# Patient Record
Sex: Female | Born: 1935 | Race: Black or African American | Hispanic: No | Smoking: Never smoker
Health system: Southern US, Community
[De-identification: ages and names within clinical notes are randomized; demographics above are authoritative.]

## PROBLEM LIST (undated history)

## (undated) DIAGNOSIS — I10 Essential (primary) hypertension: Secondary | ICD-10-CM

## (undated) DIAGNOSIS — I480 Paroxysmal atrial fibrillation: Secondary | ICD-10-CM

## (undated) DIAGNOSIS — I2510 Atherosclerotic heart disease of native coronary artery without angina pectoris: Secondary | ICD-10-CM

## (undated) HISTORY — PX: CARDIAC SURGERY: SHX584

## (undated) HISTORY — PX: MITRAL VALVE SURGERY: SHX714

---

## 2011-02-12 ENCOUNTER — Emergency Department (HOSPITAL_BASED_OUTPATIENT_CLINIC_OR_DEPARTMENT_OTHER)
Admission: EM | Admit: 2011-02-12 | Discharge: 2011-02-12 | Disposition: A | Discharge status: Home / Self Care | Payer: Medicare Other | Attending: Emergency Medicine | Admitting: Emergency Medicine

## 2011-02-12 DIAGNOSIS — N898 Other specified noninflammatory disorders of vagina: Secondary | ICD-10-CM | POA: Insufficient documentation

## 2011-02-12 DIAGNOSIS — Z79899 Other long term (current) drug therapy: Secondary | ICD-10-CM | POA: Insufficient documentation

## 2011-02-12 DIAGNOSIS — E119 Type 2 diabetes mellitus without complications: Secondary | ICD-10-CM | POA: Insufficient documentation

## 2011-02-12 DIAGNOSIS — I10 Essential (primary) hypertension: Secondary | ICD-10-CM | POA: Insufficient documentation

## 2011-02-12 LAB — BASIC METABOLIC PANEL
CO2: 31 mEq/L (ref 19–32)
Calcium: 8.8 mg/dL (ref 8.4–10.5)
Creatinine, Ser: 1.2 mg/dL (ref 0.4–1.2)
Glucose, Bld: 97 mg/dL (ref 70–99)
Sodium: 143 mEq/L (ref 135–145)

## 2011-02-12 LAB — CBC
MCH: 29.9 pg (ref 26.0–34.0)
MCHC: 33 g/dL (ref 30.0–36.0)
Platelets: 94 10*3/uL — ABNORMAL LOW (ref 150–400)
RDW: 14.2 % (ref 11.5–15.5)

## 2011-02-12 LAB — PROTIME-INR: Prothrombin Time: 14.5 seconds (ref 11.6–15.2)

## 2011-02-16 ENCOUNTER — Emergency Department (HOSPITAL_BASED_OUTPATIENT_CLINIC_OR_DEPARTMENT_OTHER)
Admission: EM | Admit: 2011-02-16 | Discharge: 2011-02-16 | Disposition: A | Discharge status: Home / Self Care | Payer: Medicare Other | Attending: Emergency Medicine | Admitting: Emergency Medicine

## 2011-02-16 DIAGNOSIS — Z79899 Other long term (current) drug therapy: Secondary | ICD-10-CM | POA: Insufficient documentation

## 2011-02-16 DIAGNOSIS — I10 Essential (primary) hypertension: Secondary | ICD-10-CM | POA: Insufficient documentation

## 2011-02-16 DIAGNOSIS — Y838 Other surgical procedures as the cause of abnormal reaction of the patient, or of later complication, without mention of misadventure at the time of the procedure: Secondary | ICD-10-CM | POA: Insufficient documentation

## 2011-02-16 DIAGNOSIS — IMO0002 Reserved for concepts with insufficient information to code with codable children: Secondary | ICD-10-CM | POA: Insufficient documentation

## 2011-02-16 DIAGNOSIS — E119 Type 2 diabetes mellitus without complications: Secondary | ICD-10-CM | POA: Insufficient documentation

## 2011-02-16 DIAGNOSIS — D500 Iron deficiency anemia secondary to blood loss (chronic): Secondary | ICD-10-CM | POA: Insufficient documentation

## 2011-02-16 LAB — DIFFERENTIAL
Basophils Absolute: 0 10*3/uL (ref 0.0–0.1)
Eosinophils Relative: 3 % (ref 0–5)
Lymphocytes Relative: 37 % (ref 12–46)
Monocytes Absolute: 0.5 10*3/uL (ref 0.1–1.0)

## 2011-02-16 LAB — CBC
HCT: 25.5 % — ABNORMAL LOW (ref 36.0–46.0)
MCHC: 32.9 g/dL (ref 30.0–36.0)
MCV: 90.1 fL (ref 78.0–100.0)
RDW: 14.2 % (ref 11.5–15.5)

## 2011-02-16 LAB — BASIC METABOLIC PANEL
BUN: 20 mg/dL (ref 6–23)
Chloride: 100 mEq/L (ref 96–112)
Glucose, Bld: 115 mg/dL — ABNORMAL HIGH (ref 70–99)
Potassium: 4.2 mEq/L (ref 3.5–5.1)

## 2011-02-16 LAB — PROTIME-INR: INR: 1.47 (ref 0.00–1.49)

## 2011-04-27 ENCOUNTER — Other Ambulatory Visit: Payer: Self-pay

## 2011-04-27 ENCOUNTER — Emergency Department (HOSPITAL_BASED_OUTPATIENT_CLINIC_OR_DEPARTMENT_OTHER)
Admission: EM | Admit: 2011-04-27 | Discharge: 2011-04-27 | Disposition: A | Discharge status: Home / Self Care | Payer: Medicare Other | Attending: Emergency Medicine | Admitting: Emergency Medicine

## 2011-04-27 DIAGNOSIS — K219 Gastro-esophageal reflux disease without esophagitis: Secondary | ICD-10-CM | POA: Insufficient documentation

## 2011-04-27 DIAGNOSIS — K2970 Gastritis, unspecified, without bleeding: Secondary | ICD-10-CM | POA: Insufficient documentation

## 2011-04-27 DIAGNOSIS — E119 Type 2 diabetes mellitus without complications: Secondary | ICD-10-CM | POA: Insufficient documentation

## 2011-04-27 DIAGNOSIS — I10 Essential (primary) hypertension: Secondary | ICD-10-CM | POA: Insufficient documentation

## 2011-04-27 DIAGNOSIS — R109 Unspecified abdominal pain: Secondary | ICD-10-CM | POA: Insufficient documentation

## 2011-04-27 DIAGNOSIS — I2510 Atherosclerotic heart disease of native coronary artery without angina pectoris: Secondary | ICD-10-CM | POA: Insufficient documentation

## 2011-04-27 HISTORY — DX: Atherosclerotic heart disease of native coronary artery without angina pectoris: I25.10

## 2011-04-27 HISTORY — DX: Essential (primary) hypertension: I10

## 2011-04-27 LAB — CBC
Hemoglobin: 12.3 g/dL (ref 12.0–15.0)
MCHC: 33.9 g/dL (ref 30.0–36.0)
RDW: 13.6 % (ref 11.5–15.5)
WBC: 4.9 10*3/uL (ref 4.0–10.5)

## 2011-04-27 LAB — COMPREHENSIVE METABOLIC PANEL
ALT: 19 U/L (ref 0–35)
AST: 36 U/L (ref 0–37)
Albumin: 3.9 g/dL (ref 3.5–5.2)
Alkaline Phosphatase: 75 U/L (ref 39–117)
Chloride: 100 mEq/L (ref 96–112)
Potassium: 4.1 mEq/L (ref 3.5–5.1)
Total Bilirubin: 0.5 mg/dL (ref 0.3–1.2)

## 2011-04-27 LAB — URINE MICROSCOPIC-ADD ON

## 2011-04-27 LAB — URINALYSIS, ROUTINE W REFLEX MICROSCOPIC
Bilirubin Urine: NEGATIVE
Glucose, UA: NEGATIVE mg/dL
Ketones, ur: NEGATIVE mg/dL
pH: 7.5 (ref 5.0–8.0)

## 2011-04-27 LAB — DIFFERENTIAL
Basophils Absolute: 0 10*3/uL (ref 0.0–0.1)
Basophils Relative: 0 % (ref 0–1)
Neutro Abs: 3.1 10*3/uL (ref 1.7–7.7)
Neutrophils Relative %: 63 % (ref 43–77)

## 2011-04-27 LAB — TROPONIN I: Troponin I: <0.3 ng/mL (ref <0.30)

## 2011-04-27 MED ORDER — GI COCKTAIL ~~LOC~~
30.0000 mL | Freq: Once | ORAL | Status: AC
Start: 2011-04-27 — End: 2011-04-27
  Administered 2011-04-27: 30 mL via ORAL
  Filled 2011-04-27: qty 30

## 2011-04-27 MED ORDER — FAMOTIDINE 20 MG PO TABS: 20.0000 mg | ORAL_TABLET | Freq: Two times a day (BID) | ORAL | Status: AC

## 2011-04-28 NOTE — ED Provider Notes (Signed)
History   The patient presents with a primary complaint of epigastric pain, but additionally reports a "knot" over her xyphoid process, progressive over several years in size, following her previous midline thoracotomy for mechanical mitral valve placement.  She reports that the bump is not tender, but is irritating as it rubs on her bra.    Chief Complaint  Patient presents with  . Abdominal Pain   Patient is a 77 y.o. female presenting with abdominal pain. The history is provided by the patient.  Abdominal Pain The primary symptoms of the illness include abdominal pain. The primary symptoms of the illness do not include fever, fatigue, shortness of breath, nausea, vomiting, diarrhea or hematemesis. The current episode started 2 days ago. The onset of the illness was gradual. Progression since onset: waxing and waning, worse with eating, better with taking Zegerid.  She didn't take her zegerid today.  The illness is associated with laxative use and eating (the patient ate barbeque chicken today making the pain worse). The patient states that she believes she is currently not pregnant. The patient has not had a change in bowel habit. Risk factors for an acute abdominal problem include being elderly. Additional symptoms associated with the illness include anorexia, heartburn and constipation. Symptoms associated with the illness do not include chills, diaphoresis, urgency, hematuria, frequency or back pain. Significant associated medical issues include PUD, GERD and cardiac disease. Significant associated medical issues do not include inflammatory bowel disease, gallstones or liver disease.    Past Medical History  Diagnosis Date  . Hypertension   . Diabetes mellitus   . Coronary artery disease     Past Surgical History  Procedure Date  . Cardiac surgery     History reviewed. No pertinent family history.  History  Substance Use Topics  . Smoking status: Never Smoker   . Smokeless  tobacco: Not on file  . Alcohol Use: No    OB History    Grav Para Term Preterm Abortions TAB SAB Ect Mult Living                  Review of Systems  Constitutional: Positive for appetite change. Negative for fever, chills, diaphoresis and fatigue.  HENT: Negative for voice change.   Respiratory: Negative.  Negative for shortness of breath.   Cardiovascular: Negative.  Negative for chest pain and palpitations.  Gastrointestinal: Positive for heartburn, abdominal pain, constipation and anorexia. Negative for nausea, vomiting, diarrhea, blood in stool, abdominal distention and hematemesis.  Genitourinary: Negative for urgency, frequency and hematuria.  Musculoskeletal: Negative for back pain.  Skin: Negative.   Neurological: Negative for dizziness, syncope, light-headedness and numbness.  Psychiatric/Behavioral: Negative.     Physical Exam  BP 164/76  Pulse 82  Temp(Src) 97.9 F (36.6 C) (Oral)  Resp 18  SpO2 96%  Physical Exam  Nursing note and vitals reviewed. Constitutional: She is oriented to person, place, and time. She appears well-developed and well-nourished. No distress.  HENT:  Head: Normocephalic and atraumatic.  Mouth/Throat: Oropharynx is clear and moist.  Eyes: Conjunctivae and EOM are normal. Pupils are equal, round, and reactive to light. No scleral icterus.  Neck: Normal range of motion. Neck supple. No JVD present. No tracheal deviation present.  Cardiovascular: Normal rate, regular rhythm and S2 normal.  Exam reveals no gallop and no friction rub.   Murmur heard.  Systolic murmur is present with a grade of 3/6       Sounds c/w mechanical mitral valve presence  Pulmonary/Chest: Effort normal and breath sounds normal. No stridor. No respiratory distress. She has no wheezes. She has no rales. She exhibits no tenderness.  Abdominal: Soft. Normal appearance and bowel sounds are normal. She exhibits no distension, no ascites, no pulsatile midline mass and no  mass. There is no hepatosplenomegaly. There is tenderness in the epigastric area. There is no rigidity, no rebound, no guarding, no CVA tenderness and negative Murphy's sign.    Musculoskeletal: Normal range of motion. She exhibits no edema and no tenderness.  Lymphadenopathy:    She has no cervical adenopathy.  Neurological: She is alert and oriented to person, place, and time.  Skin: Skin is warm and dry. No rash noted. She is not diaphoretic. No erythema.  Psychiatric: She has a normal mood and affect.    ED Course  Procedures Pt. Symptoms resolved temporarily with GI coctail before returning.  Normal cardiac enzymes. MDM Myocardial infarction, gastritis, peptic ulcer disease, gastrointestinal pain, GERD, pancreatitis, biliary colic all entertained as possible etiologies amongst other potential causes in the patient's differential diagnosis.   Date: 04/28/2011  Rate: 61  Rhythm: normal sinus rhythm  QRS Axis: left  Intervals: Wide QRS complex - Nonspecific Intraventricular Conduction Delay  ST/T Wave abnormalities: anterolateral t wave inversions  Conduction Disutrbances:nonspecific intraventricular conduction delay  Narrative Interpretation:   Old EKG Reviewed: none available      Felisa Bonier, MD 04/28/11 1523

## 2013-09-28 ENCOUNTER — Observation Stay (HOSPITAL_BASED_OUTPATIENT_CLINIC_OR_DEPARTMENT_OTHER)
Admission: EM | Admit: 2013-09-28 | Discharge: 2013-09-29 | Disposition: A | Discharge status: Home / Self Care | Payer: Medicare Other | Attending: Internal Medicine | Admitting: Internal Medicine

## 2013-09-28 ENCOUNTER — Encounter (HOSPITAL_BASED_OUTPATIENT_CLINIC_OR_DEPARTMENT_OTHER): Payer: Self-pay | Admitting: Emergency Medicine

## 2013-09-28 ENCOUNTER — Emergency Department (HOSPITAL_BASED_OUTPATIENT_CLINIC_OR_DEPARTMENT_OTHER): Payer: Medicare Other

## 2013-09-28 DIAGNOSIS — I2510 Atherosclerotic heart disease of native coronary artery without angina pectoris: Secondary | ICD-10-CM

## 2013-09-28 DIAGNOSIS — N183 Chronic kidney disease, stage 3 unspecified: Secondary | ICD-10-CM

## 2013-09-28 DIAGNOSIS — D649 Anemia, unspecified: Secondary | ICD-10-CM | POA: Diagnosis present

## 2013-09-28 DIAGNOSIS — Z952 Presence of prosthetic heart valve: Secondary | ICD-10-CM

## 2013-09-28 DIAGNOSIS — I10 Essential (primary) hypertension: Secondary | ICD-10-CM | POA: Diagnosis present

## 2013-09-28 DIAGNOSIS — E119 Type 2 diabetes mellitus without complications: Secondary | ICD-10-CM | POA: Diagnosis present

## 2013-09-28 DIAGNOSIS — R079 Chest pain, unspecified: Principal | ICD-10-CM | POA: Diagnosis present

## 2013-09-28 DIAGNOSIS — N189 Chronic kidney disease, unspecified: Secondary | ICD-10-CM | POA: Diagnosis present

## 2013-09-28 DIAGNOSIS — I4891 Unspecified atrial fibrillation: Secondary | ICD-10-CM

## 2013-09-28 LAB — CBC
Hemoglobin: 11.5 g/dL — ABNORMAL LOW (ref 12.0–15.0)
MCHC: 32.8 g/dL (ref 30.0–36.0)
RBC: 3.8 MIL/uL — ABNORMAL LOW (ref 3.87–5.11)

## 2013-09-28 LAB — COMPREHENSIVE METABOLIC PANEL
ALT: 29 U/L (ref 0–35)
Alkaline Phosphatase: 109 U/L (ref 39–117)
GFR calc Af Amer: 41 mL/min — ABNORMAL LOW (ref >90)
Glucose, Bld: 232 mg/dL — ABNORMAL HIGH (ref 70–99)
Potassium: 4.4 mEq/L (ref 3.5–5.1)
Sodium: 139 mEq/L (ref 135–145)
Total Protein: 8 g/dL (ref 6.0–8.3)

## 2013-09-28 LAB — TROPONIN I: Troponin I: <0.3 ng/mL (ref <0.30)

## 2013-09-28 LAB — GLUCOSE, CAPILLARY

## 2013-09-28 MED ORDER — ASPIRIN 81 MG PO CHEW
324.0000 mg | CHEWABLE_TABLET | Freq: Once | ORAL | Status: AC
  Administered 2013-09-28: 324 mg via ORAL
  Filled 2013-09-28: qty 4

## 2013-09-28 NOTE — Plan of Care (Signed)
79 yo F being transferred for CP r/o cardiac, has h/o CAD, MVR, chronic coumadin, her cardiologist is in [place].

## 2013-09-28 NOTE — ED Provider Notes (Signed)
CSN: [id number]     Arrival date & time 09/28/13  2030 History  This chart was scribed for Charles B. Bernette Mayers, MD by Dorothey Baseman, ED Scribe. This patient was seen in room MH09/MH09 and the patient's care was started at 8:45 PM.    Chief Complaint  Patient presents with  . Chest Pain   The history is provided by the patient. No language interpreter was used.   HPI Comments: Alexis Perry is a 79 y.o. Female with a history of HTN and CAD who presents to the Emergency Department complaining of an intermittent, burning and pressure pain to the center of the chest with associated shortness of breath and palpitations onset 3 days ago. She states that the pain is exacerbated with movement and alleviated with rest. She denies any chest pain at this time. She reports that the pain radiated to the left arm earlier today, which has since resolved. Patient reports taking Klonopin at home with mild, temporary relief. She reports that she is currently on an aspirin regimen and takes Coumadin daily for mechanical valve. Patient reports some recent weight loss (141 lbs to 112 lbs). She denies history of cancer. Patient also reports a history of two cardiac surgeries for Mitral valve repair was well as a single vessel CABG with a history of MI secondary to a stent placement in 2006.   Cards- Dr. Heron Nay PCP- Dr. Wallie Char [facility])  Past Medical History  Diagnosis Date  . Hypertension   . Diabetes mellitus   . Coronary artery disease    Past Surgical History  Procedure Laterality Date  . Cardiac surgery     History reviewed. No pertinent family history. History  Substance Use Topics  . Smoking status: Never Smoker   . Smokeless tobacco: Not on file  . Alcohol Use: No   OB History   Grav Para Term Preterm Abortions TAB SAB Ect Mult Living                 Review of Systems  A complete 10 system review of systems was obtained and all systems are negative except as noted in  the HPI and PMH.   Allergies  Review of patient's allergies indicates no known allergies.  Home Medications   Current Outpatient Rx  Name  Route  Sig  Dispense  Refill  . alendronate (FOSAMAX) 70 MG tablet   Oral   Take 70 mg by mouth every 7 (seven) days. Take with a full glass of water on an empty stomach. Take on Thursday         . amLODipine (NORVASC) 5 MG tablet   Oral   Take 5 mg by mouth daily.           Marland Kitchen aspirin 81 MG EC tablet   Oral   Take 81 mg by mouth daily.           . brimonidine (ALPHAGAN P) 0.1 % SOLN   Both Eyes   Place 1 drop into both eyes 2 (two) times daily.           . Calcium Carbonate-Vitamin D (CALTRATE 600+D) 600-400 MG-UNIT per tablet   Oral   Take 1 tablet by mouth daily.           . carvedilol (COREG) 25 MG tablet   Oral   Take 25 mg by mouth 2 (two) times daily with a meal.           . clonazePAM (  KLONOPIN) 1 MG tablet   Oral   Take 0.5 mg by mouth at bedtime as needed. For sleep         . cycloSPORINE (RESTASIS) 0.05 % ophthalmic emulsion   Both Eyes   Place 1 drop into both eyes 2 (two) times daily.           . ferrous sulfate 325 (65 FE) MG tablet   Oral   Take 325 mg by mouth daily with breakfast.          . gabapentin (NEURONTIN) 300 MG capsule   Oral   Take 300 mg by mouth at bedtime.           Marland Kitchen losartan-hydrochlorothiazide (HYZAAR) 100-25 MG per tablet   Oral   Take 1 tablet by mouth daily.           Marland Kitchen loteprednol (LOTEMAX) 0.5 % ophthalmic suspension   Both Eyes   Place 1 drop into both eyes 2 (two) times daily as needed. For eye soreness         . magnesium citrate solution   Oral   Take 296 mLs by mouth once.           . magnesium hydroxide (MILK OF MAGNESIA) 800 MG/5ML suspension   Oral   Take 30 mLs by mouth daily as needed. For indigestion          . Multiple Vitamin (MULTIVITAMIN PO)   Oral   Take 1 tablet by mouth daily.           Marland Kitchen omeprazole-sodium bicarbonate (ZEGERID)  40-1100 MG per capsule   Oral   Take 1 capsule by mouth daily before breakfast.           . Polyethyl Glycol-Propyl Glycol (SYSTANE OP)   Ophthalmic   Apply 1-2 drops to eye as needed. For dry eyes          . pregabalin (LYRICA) 150 MG capsule   Oral   Take 150 mg by mouth 2 (two) times daily.           . Psyllium (METAMUCIL) 48.57 % POWD   Oral   Take 1 each by mouth 2 (two) times daily as needed. For constipation          . senna (SENOKOT) 8.6 MG tablet   Oral   Take 2 tablets by mouth 2 (two) times daily as needed.          . simvastatin (ZOCOR) 40 MG tablet   Oral   Take 40 mg by mouth at bedtime.           . traMADol (ULTRAM) 50 MG tablet   Oral   Take 50 mg by mouth 2 (two) times daily as needed. For pain         . travoprost, benzalkonium, (TRAVATAN) 0.004 % ophthalmic solution   Both Eyes   Place 1 drop into both eyes at bedtime.           Marland Kitchen warfarin (COUMADIN) 5 MG tablet   Oral   Take 5-7.5 mg by mouth daily. Take 5mg  (1 tab) on Mon, Wed, Fri, and Sat. Take 7.5mg  (1 and 1/2 tabs) on Sun, Tues and Thurs           Triage Vitals: BP 125/82  Pulse 109  Temp(Src) 97.6 F (36.4 C) (Oral)  Resp 16  Ht 5\' 2"  (1.575 m)  Wt 112 lb (50.803 kg)  BMI 20.48 kg/m2  SpO2 100%  Physical Exam  Nursing note and vitals reviewed. Constitutional: She is oriented to person, place, and time. She appears well-developed and well-nourished.  HENT:  Head: Normocephalic and atraumatic.  Eyes: EOM are normal. Pupils are equal, round, and reactive to light.  Neck: Normal range of motion. Neck supple.  Cardiovascular: Normal rate, normal heart sounds and intact distal pulses.   Atrial fibrillation.   Pulmonary/Chest: Effort normal and breath sounds normal.  Abdominal: Soft. Bowel sounds are normal. She exhibits no distension. There is no tenderness.  Musculoskeletal: Normal range of motion. She exhibits no edema and no tenderness.  Neurological: She is alert and  oriented to person, place, and time. She has normal strength. No cranial nerve deficit or sensory deficit.  Skin: Skin is warm and dry. No rash noted.  Psychiatric: She has a normal mood and affect.    ED Course  Procedures (including critical care time)  DIAGNOSTIC STUDIES: Oxygen Saturation is 100% on room air, normal by my interpretation.    COORDINATION OF CARE: 8:50 PM- Ordered an EKG, blood labs, and a chest x-ray. Will order an aspirin to manage symptoms. Discussed that the patient may need to be admitted to the hospital. Discussed treatment plan with patient at bedside and patient verbalized agreement.     Labs Review Labs Reviewed  CBC - Abnormal; Notable for the following:    RBC 3.80 (*)    Hemoglobin 11.5 (*)    HCT 35.1 (*)    All other components within normal limits  COMPREHENSIVE METABOLIC PANEL - Abnormal; Notable for the following:    Glucose, Bld 232 (*)    BUN 34 (*)    Creatinine, Ser 1.40 (*)    AST 46 (*)    GFR calc non Af Amer 35 (*)    GFR calc Af Amer 41 (*)    All other components within normal limits  PROTIME-INR - Abnormal; Notable for the following:    Prothrombin Time 30.8 (*)    INR 3.10 (*)    All other components within normal limits  TROPONIN I   Imaging Review Dg Chest 2 View  09/28/2013   CLINICAL DATA:  Left-sided chest pain. Shortness of breath. Palpitations. Recent 30 lb weight loss.  EXAM: CHEST  2 VIEW  COMPARISON:  None.  FINDINGS: Moderate cardiomegaly noted. Both lungs are clear. No evidence of pleural effusion. Mild ectasia of the thoracic aorta noted. Previous median sternotomy noted.  IMPRESSION: Moderate cardiomegaly.  No active lung disease.   Electronically Signed   By: Myles Rosenthal M.D.   On: 09/28/2013 21:36    EKG Interpretation    Date/Time:  Sunday September 28 2013 20:38:24 EST Ventricular Rate:  106 PR Interval:    QRS Duration: 128 QT Interval:  370 QTC Calculation: 491 R Axis:   -53 Text Interpretation:   Atrial fibrillation with rapid ventricular response Left axis deviation Left ventricular hypertrophy with QRS widening Cannot rule out Septal infarct , age undetermined Lateral infarct , age undetermined Abnormal ECG Since last tracing Rate faster Atrial fibrillation NOW PRESENT Confirmed by Bernette Mayers  MD, CHARLES [phone]) on 09/28/2013 8:44:07 PM            MDM   1. Chest pain   2. New onset a-fib     Pt remains pain free in the ED. New onset afib but rate well controlled also with new pattern of chest pain the last few days. She does not want to go to [facility].  Discussed with Dr. Julian Reil on call at [facility] who will accept the patient for transfer.   I personally performed the services described in this documentation, which was scribed in my presence. The recorded information has been reviewed and is accurate.       Charles B. Bernette Mayers, MD 09/28/13 2255

## 2013-09-28 NOTE — ED Notes (Signed)
Pt reports left sided chest pain with SOB x 3 days off and on

## 2013-09-29 ENCOUNTER — Encounter (HOSPITAL_COMMUNITY): Payer: Self-pay | Admitting: Internal Medicine

## 2013-09-29 DIAGNOSIS — I10 Essential (primary) hypertension: Secondary | ICD-10-CM | POA: Diagnosis present

## 2013-09-29 DIAGNOSIS — I2510 Atherosclerotic heart disease of native coronary artery without angina pectoris: Secondary | ICD-10-CM

## 2013-09-29 DIAGNOSIS — I4891 Unspecified atrial fibrillation: Secondary | ICD-10-CM

## 2013-09-29 DIAGNOSIS — D649 Anemia, unspecified: Secondary | ICD-10-CM | POA: Diagnosis present

## 2013-09-29 DIAGNOSIS — Z952 Presence of prosthetic heart valve: Secondary | ICD-10-CM

## 2013-09-29 DIAGNOSIS — R079 Chest pain, unspecified: Secondary | ICD-10-CM

## 2013-09-29 DIAGNOSIS — Z954 Presence of other heart-valve replacement: Secondary | ICD-10-CM

## 2013-09-29 DIAGNOSIS — E119 Type 2 diabetes mellitus without complications: Secondary | ICD-10-CM | POA: Diagnosis present

## 2013-09-29 DIAGNOSIS — N189 Chronic kidney disease, unspecified: Secondary | ICD-10-CM | POA: Diagnosis present

## 2013-09-29 LAB — PROTIME-INR: Prothrombin Time: 32.8 seconds — ABNORMAL HIGH (ref 11.6–15.2)

## 2013-09-29 LAB — GLUCOSE, CAPILLARY
Glucose-Capillary: 137 mg/dL — ABNORMAL HIGH (ref 70–99)
Glucose-Capillary: 135 mg/dL — ABNORMAL HIGH (ref 70–99)

## 2013-09-29 LAB — TROPONIN I
Troponin I: <0.3 ng/mL (ref <0.30)
Troponin I: <0.3 ng/mL (ref <0.30)
Troponin I: <0.3 ng/mL (ref <0.30)

## 2013-09-29 LAB — CBC
HCT: 30.9 % — ABNORMAL LOW (ref 36.0–46.0)
MCH: 30.5 pg (ref 26.0–34.0)
MCHC: 33 g/dL (ref 30.0–36.0)
Platelets: 130 10*3/uL — ABNORMAL LOW (ref 150–400)
RBC: 3.34 MIL/uL — ABNORMAL LOW (ref 3.87–5.11)
WBC: 4.1 10*3/uL (ref 4.0–10.5)

## 2013-09-29 LAB — BASIC METABOLIC PANEL
Calcium: 9.2 mg/dL (ref 8.4–10.5)
GFR calc Af Amer: 48 mL/min — ABNORMAL LOW (ref >90)
GFR calc non Af Amer: 42 mL/min — ABNORMAL LOW (ref >90)
Glucose, Bld: 146 mg/dL — ABNORMAL HIGH (ref 70–99)
Sodium: 139 mEq/L (ref 135–145)

## 2013-09-29 MED ORDER — SODIUM CHLORIDE 0.9 % IJ SOLN: 3.0000 mL | Freq: Two times a day (BID) | INTRAMUSCULAR | Status: DC

## 2013-09-29 MED ORDER — SIMVASTATIN 40 MG PO TABS
40.0000 mg | ORAL_TABLET | Freq: Every day | ORAL | Status: DC
  Filled 2013-09-29 (×2): qty 1

## 2013-09-29 MED ORDER — WARFARIN - PHARMACIST DOSING INPATIENT: Freq: Every day | Status: DC

## 2013-09-29 MED ORDER — LOSARTAN POTASSIUM-HCTZ 100-25 MG PO TABS: 1.0000 | ORAL_TABLET | Freq: Every day | ORAL | Status: DC

## 2013-09-29 MED ORDER — AMLODIPINE BESYLATE 5 MG PO TABS
5.0000 mg | ORAL_TABLET | Freq: Every day | ORAL | Status: DC
  Administered 2013-09-29: 5 mg via ORAL
  Filled 2013-09-29: qty 1

## 2013-09-29 MED ORDER — DIGOXIN 125 MCG PO TABS: 0.1250 mg | ORAL_TABLET | Freq: Every day | ORAL | Status: DC

## 2013-09-29 MED ORDER — HYDROCHLOROTHIAZIDE 25 MG PO TABS
25.0000 mg | ORAL_TABLET | Freq: Every day | ORAL | Status: DC
  Administered 2013-09-29: 25 mg via ORAL
  Filled 2013-09-29: qty 1

## 2013-09-29 MED ORDER — ACETAMINOPHEN 650 MG RE SUPP: 650.0000 mg | Freq: Four times a day (QID) | RECTAL | Status: DC | PRN

## 2013-09-29 MED ORDER — ACETAMINOPHEN 325 MG PO TABS: 650.0000 mg | ORAL_TABLET | Freq: Four times a day (QID) | ORAL | Status: DC | PRN

## 2013-09-29 MED ORDER — PANTOPRAZOLE SODIUM 40 MG PO TBEC
40.0000 mg | DELAYED_RELEASE_TABLET | Freq: Every day | ORAL | Status: DC
  Administered 2013-09-29: 40 mg via ORAL
  Filled 2013-09-29: qty 1

## 2013-09-29 MED ORDER — LOSARTAN POTASSIUM 50 MG PO TABS
100.0000 mg | ORAL_TABLET | Freq: Every day | ORAL | Status: DC
  Administered 2013-09-29: 100 mg via ORAL
  Filled 2013-09-29: qty 2

## 2013-09-29 MED ORDER — BRIMONIDINE TARTRATE 0.2 % OP SOLN
1.0000 [drp] | Freq: Three times a day (TID) | OPHTHALMIC | Status: DC
  Administered 2013-09-29: 1 [drp] via OPHTHALMIC
  Filled 2013-09-29: qty 5

## 2013-09-29 MED ORDER — LINAGLIPTIN 5 MG PO TABS
5.0000 mg | ORAL_TABLET | Freq: Every day | ORAL | Status: DC
  Administered 2013-09-29: 5 mg via ORAL
  Filled 2013-09-29: qty 1

## 2013-09-29 MED ORDER — CALCIUM CARBONATE-VITAMIN D 500-200 MG-UNIT PO TABS
1.0000 | ORAL_TABLET | Freq: Every day | ORAL | Status: DC
  Administered 2013-09-29: 1 via ORAL
  Filled 2013-09-29 (×2): qty 1

## 2013-09-29 MED ORDER — WARFARIN SODIUM 5 MG PO TABS
5.0000 mg | ORAL_TABLET | Freq: Once | ORAL | Status: DC
  Filled 2013-09-29: qty 1

## 2013-09-29 MED ORDER — PREGABALIN 50 MG PO CAPS
150.0000 mg | ORAL_CAPSULE | Freq: Two times a day (BID) | ORAL | Status: DC
Start: 2013-09-29 — End: 2013-09-29
  Administered 2013-09-29: 150 mg via ORAL
  Filled 2013-09-29: qty 3

## 2013-09-29 MED ORDER — TRAVOPROST (BAK FREE) 0.004 % OP SOLN
1.0000 [drp] | Freq: Every day | OPHTHALMIC | Status: DC
  Filled 2013-09-29: qty 2.5

## 2013-09-29 MED ORDER — ONDANSETRON HCL 4 MG/2ML IJ SOLN: 4.0000 mg | Freq: Four times a day (QID) | INTRAMUSCULAR | Status: DC | PRN

## 2013-09-29 MED ORDER — CYCLOSPORINE 0.05 % OP EMUL
1.0000 [drp] | Freq: Two times a day (BID) | OPHTHALMIC | Status: DC
  Administered 2013-09-29: 1 [drp] via OPHTHALMIC
  Filled 2013-09-29 (×2): qty 1

## 2013-09-29 MED ORDER — GABAPENTIN 300 MG PO CAPS
300.0000 mg | ORAL_CAPSULE | Freq: Every day | ORAL | Status: DC
  Filled 2013-09-29: qty 1

## 2013-09-29 MED ORDER — INSULIN ASPART 100 UNIT/ML ~~LOC~~ SOLN
0.0000 [IU] | Freq: Three times a day (TID) | SUBCUTANEOUS | Status: DC
  Administered 2013-09-29 (×2): 1 [IU] via SUBCUTANEOUS

## 2013-09-29 MED ORDER — ONDANSETRON HCL 4 MG PO TABS: 4.0000 mg | ORAL_TABLET | Freq: Four times a day (QID) | ORAL | Status: DC | PRN

## 2013-09-29 MED ORDER — FERROUS SULFATE 325 (65 FE) MG PO TABS
325.0000 mg | ORAL_TABLET | Freq: Every day | ORAL | Status: DC
  Administered 2013-09-29: 325 mg via ORAL
  Filled 2013-09-29 (×2): qty 1

## 2013-09-29 MED ORDER — DILTIAZEM HCL ER COATED BEADS 120 MG PO CP24: 120.0000 mg | ORAL_CAPSULE | Freq: Every day | ORAL | Status: DC

## 2013-09-29 MED ORDER — DILTIAZEM HCL ER COATED BEADS 120 MG PO CP24
120.0000 mg | ORAL_CAPSULE | Freq: Every day | ORAL | Status: DC
  Administered 2013-09-29: 120 mg via ORAL
  Filled 2013-09-29: qty 1

## 2013-09-29 MED ORDER — CLONAZEPAM 0.5 MG PO TABS: 0.5000 mg | ORAL_TABLET | Freq: Every evening | ORAL | Status: DC | PRN

## 2013-09-29 MED ORDER — CARVEDILOL 25 MG PO TABS
25.0000 mg | ORAL_TABLET | Freq: Two times a day (BID) | ORAL | Status: DC
  Administered 2013-09-29: 25 mg via ORAL
  Filled 2013-09-29 (×3): qty 1

## 2013-09-29 MED ORDER — ASPIRIN EC 81 MG PO TBEC
81.0000 mg | DELAYED_RELEASE_TABLET | Freq: Every day | ORAL | Status: DC
  Administered 2013-09-29: 81 mg via ORAL
  Filled 2013-09-29: qty 1

## 2013-09-29 MED ORDER — TRAMADOL HCL 50 MG PO TABS: 50.0000 mg | ORAL_TABLET | Freq: Two times a day (BID) | ORAL | Status: DC | PRN

## 2013-09-29 MED ORDER — SODIUM CHLORIDE 0.9 % IJ SOLN
3.0000 mL | Freq: Two times a day (BID) | INTRAMUSCULAR | Status: DC
  Administered 2013-09-29: 3 mL via INTRAVENOUS

## 2013-09-29 MED ORDER — ATORVASTATIN CALCIUM 20 MG PO TABS
20.0000 mg | ORAL_TABLET | Freq: Every day | ORAL | Status: DC
  Filled 2013-09-29: qty 1

## 2013-09-29 MED ORDER — SENNA 8.6 MG PO TABS
2.0000 | ORAL_TABLET | Freq: Two times a day (BID) | ORAL | Status: DC | PRN
  Filled 2013-09-29: qty 2

## 2013-09-29 NOTE — Progress Notes (Signed)
ECHO EF 35-40%  Distal anteroseptal and mid to distal lateral wall akinesis (likely old infarct)  Appears comfortable. Heart rate in 60's during echo and at times appears sinus.   Instead of diltiazem CD 120, I would like her to be on Digoxin once a day.   This is in addition to her coreg 25 BID.   She will be seeing her cardiologist on Wed. Will send records.

## 2013-09-29 NOTE — Progress Notes (Deleted)
ANTICOAGULATION CONSULT NOTE - Initial Consult  Pharmacy Consult for Warfarin  Indication: Mechanical valve, ?location  No Known Allergies  Patient Measurements: Height: 5\' 2"  (157.5 cm) Weight: 114 lb 3.2 oz (51.801 kg) IBW/kg (Calculated) : 50.1  Vital Signs: Temp: 97.5 F (36.4 C) (12/14 2340) Temp src: Oral (12/14 2340) BP: 152/58 mmHg (12/14 2340) Pulse Rate: 60 (12/14 2340)  Labs:  Recent Labs  09/28/13 2040  HGB 11.5*  HCT 35.1*  PLT 154  LABPROT 30.8*  INR 3.10*  CREATININE 1.40*  TROPONINI <0.30    Estimated Creatinine Clearance: 26.2 ml/min (by C-G formula based on Cr of 1.4).   Medical History: Past Medical History  Diagnosis Date  . Hypertension   . Diabetes mellitus   . Coronary artery disease     Medications:  Warfarin PTA  Assessment: 80 y/o tx from [facility], INR 3.10 on admit, other labs as above.   Goal of Therapy:  INR (depending on valve location) Monitor platelets by anticoagulation protocol: Yes   Plan:  -PT/INR at 0500 -Unable to get med rec, f/u day shift for PTA dose -F/U valve location for INR goal   Abran Duke 09/29/2013,1:55 AM

## 2013-09-29 NOTE — H&P (Signed)
[facility] History and Physical  Alexis Perry ZOX:[id number] DOB: 09-19-1933 DOA: 09/28/2013  Referring physician: Patient was transferred from [facility]. PCP: No primary provider on file.  Specialists: Dr. Bary Castilla. Cardiologist. [place].  Chief Complaint: Chest pain.  HPI: Alexis Perry is a 79 y.o. female history of mitral valve replacement mechanical, CAD, hypertension, diabetes mellitus presented to the ER because of chest pain. Patient states that she has been experiencing chest pain off and on for last 3 days. On Friday 2 days ago patient started experiencing retrosternal pressure-like symptoms which lasted for 15 minutes which was experienced again in the morning on Saturday. Both these times patient symptoms resolved spontaneously. Last night patient was in a store when she started experiencing chest pain retrosternal and at this time it was slightly burning in sensation. Patient also had mild shortness of breath. Denies any associated diaphoresis palpitations. Symptoms resolved spontaneously after 15 minutes. Patient presented to the ER. In the ER EKG shows atrial fibrillation rate around 106 beats per minute. Patient states that she has not had A. fib previously to her knowledge. Patient's INR was therapeutic, Coumadin she takes for mechanical mitral valve. Cardiac markers were negative. Patient has been admitted for further workup. Patient otherwise denies any nausea vomiting abdominal pain diarrhea headache focal deficits.  Review of Systems: As presented in the history of presenting illness, rest negative.  Past Medical History  Diagnosis Date  . Hypertension   . Diabetes mellitus   . Coronary artery disease    Past Surgical History  Procedure Laterality Date  . Cardiac surgery    . Mitral valve surgery     Social History:  reports that she has never smoked. She does not have any smokeless tobacco history on file. She reports that she does not drink  alcohol or use illicit drugs. Where does patient live home. Can patient participate in ADLs? Yes.  No Known Allergies  Family History:  Family History  Problem Relation Age of Onset  . Stroke Brother       Prior to Admission medications   Medication Sig Start Date End Date Taking? Authorizing Provider  alendronate (FOSAMAX) 70 MG tablet Take 70 mg by mouth every 7 (seven) days. Take with a full glass of water on an empty stomach. Take on Thursday    Historical Provider, MD  amLODipine (NORVASC) 5 MG tablet Take 5 mg by mouth daily.      Historical Provider, MD  aspirin 81 MG EC tablet Take 81 mg by mouth daily.      Historical Provider, MD  brimonidine (ALPHAGAN P) 0.1 % SOLN Place 1 drop into both eyes 2 (two) times daily.      Historical Provider, MD  Calcium Carbonate-Vitamin D (CALTRATE 600+D) 600-400 MG-UNIT per tablet Take 1 tablet by mouth daily.      Historical Provider, MD  carvedilol (COREG) 25 MG tablet Take 25 mg by mouth 2 (two) times daily with a meal.      Historical Provider, MD  clonazePAM (KLONOPIN) 1 MG tablet Take 0.5 mg by mouth at bedtime as needed. For sleep    Historical Provider, MD  cycloSPORINE (RESTASIS) 0.05 % ophthalmic emulsion Place 1 drop into both eyes 2 (two) times daily.      Historical Provider, MD  ferrous sulfate 325 (65 FE) MG tablet Take 325 mg by mouth daily with breakfast.     Historical Provider, MD  gabapentin (NEURONTIN) 300 MG capsule Take 300 mg by mouth  at bedtime.      Historical Provider, MD  losartan-hydrochlorothiazide (HYZAAR) 100-25 MG per tablet Take 1 tablet by mouth daily.      Historical Provider, MD  loteprednol (LOTEMAX) 0.5 % ophthalmic suspension Place 1 drop into both eyes 2 (two) times daily as needed. For eye soreness    Historical Provider, MD  magnesium citrate solution Take 296 mLs by mouth once.      Historical Provider, MD  magnesium hydroxide (MILK OF MAGNESIA) 800 MG/5ML suspension Take 30 mLs by mouth daily as  needed. For indigestion     Historical Provider, MD  Multiple Vitamin (MULTIVITAMIN PO) Take 1 tablet by mouth daily.      Historical Provider, MD  omeprazole-sodium bicarbonate (ZEGERID) 40-1100 MG per capsule Take 1 capsule by mouth daily before breakfast.      Historical Provider, MD  Polyethyl Glycol-Propyl Glycol (SYSTANE OP) Apply 1-2 drops to eye as needed. For dry eyes     Historical Provider, MD  pregabalin (LYRICA) 150 MG capsule Take 150 mg by mouth 2 (two) times daily.      Historical Provider, MD  Psyllium (METAMUCIL) 48.57 % POWD Take 1 each by mouth 2 (two) times daily as needed. For constipation     Historical Provider, MD  senna (SENOKOT) 8.6 MG tablet Take 2 tablets by mouth 2 (two) times daily as needed.     Historical Provider, MD  simvastatin (ZOCOR) 40 MG tablet Take 40 mg by mouth at bedtime.      Historical Provider, MD  traMADol (ULTRAM) 50 MG tablet Take 50 mg by mouth 2 (two) times daily as needed. For pain    Historical Provider, MD  travoprost, benzalkonium, (TRAVATAN) 0.004 % ophthalmic solution Place 1 drop into both eyes at bedtime.      Historical Provider, MD  warfarin (COUMADIN) 5 MG tablet Take 5-7.5 mg by mouth daily. Take 5mg  (1 tab) on Mon, Wed, Fri, and Sat. Take 7.5mg  (1 and 1/2 tabs) on Sun, Tues and Thurs     Historical Provider, MD    Physical Exam: Filed Vitals:   09/28/13 2041 09/28/13 2044 09/28/13 2208 09/28/13 2340  BP: 125/82  127/60 152/58  Pulse: 109  78 60  Temp:  97.6 F (36.4 C)  97.5 F (36.4 C)  TempSrc: Oral Oral  Oral  Resp: 16  25 15   Height: 5\' 2"  (1.575 m)   5\' 2"  (1.575 m)  Weight: 50.803 kg (112 lb)   51.801 kg (114 lb 3.2 oz)  SpO2: 100%  100% 97%     General:  Well-developed and nourished.  Eyes: Anicteric no pallor.  ENT: No discharge from ears eyes nose mouth.  Neck: No mass felt.  Cardiovascular: S1-S2 heard.  Respiratory: No rhonchi or crepitations.  Abdomen: Soft nontender bowel sounds present.  Skin: No  rash.  Musculoskeletal: No edema.  Psychiatric: Appears normal.  Neurologic: Alert awake oriented to time place and person. Moves all extremities.  Labs on Admission:  Basic Metabolic Panel:  Recent Labs Lab 09/28/13 2040  NA 139  K 4.4  CL 101  CO2 28  GLUCOSE 232*  BUN 34*  CREATININE 1.40*  CALCIUM 9.9   Liver Function Tests:  Recent Labs Lab 09/28/13 2040  AST 46*  ALT 29  ALKPHOS 109  BILITOT 0.5  PROT 8.0  ALBUMIN 3.9   No results found for this basename: LIPASE, AMYLASE,  in the last 168 hours No results found for this basename: AMMONIA,  in the last 168 hours CBC:  Recent Labs Lab 09/28/13 2040  WBC 4.7  HGB 11.5*  HCT 35.1*  MCV 92.4  PLT 154   Cardiac Enzymes:  Recent Labs Lab 09/28/13 2040  TROPONINI <0.30    BNP (last 3 results) No results found for this basename: PROBNP,  in the last 8760 hours CBG:  Recent Labs Lab 09/28/13 2254  GLUCAP 209*    Radiological Exams on Admission: Dg Chest 2 View  09/28/2013   CLINICAL DATA:  Left-sided chest pain. Shortness of breath. Palpitations. Recent 30 lb weight loss.  EXAM: CHEST  2 VIEW  COMPARISON:  None.  FINDINGS: Moderate cardiomegaly noted. Both lungs are clear. No evidence of pleural effusion. Mild ectasia of the thoracic aorta noted. Previous median sternotomy noted.  IMPRESSION: Moderate cardiomegaly.  No active lung disease.   Electronically Signed   By: Myles Rosenthal M.D.   On: 09/28/2013 21:36    EKG: Independently reviewed. Atrial fibrillation with rate around 106 beats per minute.  Assessment/Plan Principal Problem:   Chest pain Active Problems:   Diabetes mellitus   New onset a-fib   HTN (hypertension)   Mitral valve replaced   CKD (chronic kidney disease)   Anemia   1. Chest pain - presently chest pain-free. I have ordered further cardiac markers and also 2-D echo. Continue aspirin. 2. Atrial fibrillation - presently rate controlled. Patient is on Coumadin to be  dosed per pharmacy. 3. Mechanical mitral valve - patient's Coumadin is therapeutic. Dosed per pharmacy. Check 2-D echo. 4. Hypertension - continue home medications. 5. Diabetes mellitus type 2 - on Onglyza. 6. Chronic kidney disease - patient states she was told that she has chronic kidney disease. Baseline creatinine not known. Closely follow intake output and metabolic panel. 7. Anemia - probably chronic and may be secondary to kidney disease. Patient is on iron tablets. Closely follow CBC.  Have requested patient's chart from patient's cardiologist at [place]. I also personally reviewed patient's chest x-ray.    Code Status:Full code.  Family Communication: Patient's daughter at the bedside.  Disposition Plan: Admit for observation.    Ednamae Schiano N. [facility] Pager [phone].  If 7PM-7AM, please contact night-coverage [email or web address] Password [email or web address] 09/29/2013, 1:39 AM

## 2013-09-29 NOTE — Progress Notes (Signed)
DC orders received.  Patient stable with no S/S of distress.  Medication and discharge information reviewed with patient and patient's family member.  Patient DC home with family. Alexis Perry  

## 2013-09-29 NOTE — Discharge Summary (Addendum)
Physician Discharge Summary  Alexis Perry HYQ:[id number] DOB: 1933/05/18 DOA: 09/28/2013  PCP: No primary provider on file.  Admit date: 09/28/2013 Discharge date: 09/29/2013  Recommendations for Outpatient Follow-up:  1. Pt will need to follow up with PCP in 2 weeks post discharge 2. Please obtain BMP to evaluate electrolytes and kidney function 3. Please also check CBC to evaluate Hg and Hct levels 4. Follow up with cardiology, Dr. Bary Castilla in [facility]   Discharge Diagnoses:  Principal Problem:   Chest pain Active Problems:   Diabetes mellitus   New onset a-fib   HTN (hypertension)   Mitral valve replaced   CKD (chronic kidney disease)   Anemia   CKD (chronic kidney disease) stage 3, GFR 30-59 ml/min Chest discomfort  -Patient has typical and atypical components  -May also be partly related to anxiety  -She has requested cardiology evaluation  -Troponins negative x3  -Continue aspirin  -anginal symptoms may be caused by intermitten runs of RVR -remain pain free during hospitalization Atrial Fibrillation/history of MVR  -Continue warfarin  -INR 3.10 at the time of admission  -Rate controlled  -Continue carvedilol  -Dr. Anne Fu saw pt and initially recommended starting cardizem CD 120mg  daily and follow up with her cardiologist with whom she already has an appt on 10/01/13.  However after her echo results were interpreted, he stopped diltiazem due to neg inotropic effects and pt was prescribed digoxin 0.125mg  daily. -echo--EF 35-40% with akinesis of anteroseptal and anterolateral myocardium -d/c amlodipine Diabetes mellitus type 2  -Patient states that hemoglobin A1c 6.8 in September 2014  -NovoLog sliding scale  -resume saxagliptin as outpt and follow up with pcp CKD stage III  -Baseline creatinine 1.1-1.2  Hypertension  -Continue carvedilol, losartan, HCTZ  Hyperlipidemia  -Continue statin  Family Communication: Daughter at beside     Discharge Condition:  stable  Disposition: home  Diet:heart healthy Wt Readings from Last 3 Encounters:  09/28/13 51.801 kg (114 lb 3.2 oz)   Brief history  79 year old female with history of diabetes mellitus, hypertension, coronary disease, mitral valve replacement--on warfarin presents with 3 day history of chest discomfort. She states that the discomfort radiated to her left arm. There was no diaphoresis, dizziness, nausea, vomiting, syncope. Patient states that the chest discomfort occurred with activity and was relieved with some rest. She states that she goes to the "fitness center" 2-3 times per week. In the past month, she has noted decreased exercise endurance. Interestingly, the patient did take a dose of clonazepam on the day prior to admission which relieved her chest discomfort. She states that she has had coronary artery bypass graft x1 vessel. She had MVR at [facility] April 2007. In addition, she has been checking blood pressure at home and her monitor has been registering "error" in the heart rate.  In the ED, she was found to be in atrial fibrillation with HR in low 100s. This was confirmed by EKG. An EKG on 04/27/2011 suggested atrial fibrillation. Troponins have been negative x3. The patient remained hemodynamically stable and rate controlled. Cardiology evaluated the patient and did not feel that the patient needed urgent cardioversion nor inpatient stress test. The soft tissue was stable for discharge. The patient will follow up with her established cardiologist on 10/01/2013 with whom she has an appointment. Amlodipine was discontinued. The patient was started on diltiazem.   Consultants: Cardiology--Dr. Anne Fu  Discharge Exam: Filed Vitals:   09/29/13 1251  BP: 151/67  Pulse: 67  Temp: 98.3 F (36.8 C)  Resp: 17   Filed Vitals:   09/29/13 0526 09/29/13 0700 09/29/13 1003 09/29/13 1251  BP: 141/63 162/83 125/58 151/67  Pulse: 67 73 68 67  Temp: 98.4 F (36.9 C)   98.3 F (36.8 C)   TempSrc: Oral   Oral  Resp: 15   17  Height:      Weight:      SpO2: 100%   100%   General: A&O x 3, NAD, pleasant, cooperative Cardiovascular: RRR, no rub, no gallop, no S3 Respiratory: CTAB, no wheeze, no rhonchi Abdomen:soft, nontender, nondistended, positive bowel sounds Extremities: trace LE edema, No lymphangitis, no petechiae  Discharge Instructions      Discharge Orders   Future Orders Complete By Expires   Diet - low sodium heart healthy  As directed    Increase activity slowly  As directed        Medication List    STOP taking these medications       amLODipine 10 MG tablet  Commonly known as:  NORVASC      TAKE these medications       alendronate 70 MG tablet  Commonly known as:  FOSAMAX  Take 70 mg by mouth once a week. Take with a full glass of water on an empty stomach.     aspirin EC 81 MG tablet  Take 81 mg by mouth daily.     atorvastatin 10 MG tablet  Commonly known as:  LIPITOR  Take 10 mg by mouth every evening.     carvedilol 25 MG tablet  Commonly known as:  COREG  Take 12.5 mg by mouth 2 (two) times daily with a meal.     cholecalciferol 1000 UNITS tablet  Commonly known as:  VITAMIN D  Take 1,000 Units by mouth daily.     clonazePAM 1 MG tablet  Commonly known as:  KLONOPIN  Take 0.5 mg by mouth as needed for anxiety.     COMBIGAN 0.2-0.5 % ophthalmic solution  Generic drug:  brimonidine-timolol  Place 1 drop into both eyes every 12 (twelve) hours.     digoxin 0.125 MG tablet  Commonly known as:  LANOXIN  Take 1 tablet (0.125 mg total) by mouth daily.     fexofenadine 30 MG tablet  Commonly known as:  ALLEGRA  Take 30 mg by mouth daily as needed (allergies).     gabapentin 300 MG capsule  Commonly known as:  NEURONTIN  Take 300 mg by mouth every evening.     losartan-hydrochlorothiazide 100-25 MG per tablet  Commonly known as:  HYZAAR  Take 0.5 tablets by mouth daily.     lubiprostone 8 MCG capsule  Commonly known  as:  AMITIZA  Take 8 mcg by mouth daily as needed for constipation.     multivitamin tablet  Take 1 tablet by mouth daily.     OVER THE COUNTER MEDICATION  Apply 1 drop to eye 2 (two) times daily as needed (dry eyes).     pantoprazole 40 MG tablet  Commonly known as:  PROTONIX  Take 40 mg by mouth daily.     saxagliptin HCl 2.5 MG Tabs tablet  Commonly known as:  ONGLYZA  Take 2.5 mg by mouth daily.     senna 8.6 MG Tabs tablet  Commonly known as:  SENOKOT  Take 2 tablets by mouth at bedtime as needed for mild constipation.     SYSTANE OP  Apply 1-2 drops to eye as needed (dry eyes).  traMADol 50 MG tablet  Commonly known as:  ULTRAM  Take 50 mg by mouth 2 (two) times daily as needed for moderate pain.     travoprost (benzalkonium) 0.004 % ophthalmic solution  Commonly known as:  TRAVATAN  Place 1 drop into both eyes at bedtime.     warfarin 5 MG tablet  Commonly known as:  COUMADIN  Take 5-7.5 mg by mouth daily. Takes 1 1/2 tablets (7.5mg ) daily except Monday takes 1 tablet (5mg )         The results of significant diagnostics from this hospitalization (including imaging, microbiology, ancillary and laboratory) are listed below for reference.    Significant Diagnostic Studies: Dg Chest 2 View  09/28/2013   CLINICAL DATA:  Left-sided chest pain. Shortness of breath. Palpitations. Recent 30 lb weight loss.  EXAM: CHEST  2 VIEW  COMPARISON:  None.  FINDINGS: Moderate cardiomegaly noted. Both lungs are clear. No evidence of pleural effusion. Mild ectasia of the thoracic aorta noted. Previous median sternotomy noted.  IMPRESSION: Moderate cardiomegaly.  No active lung disease.   Electronically Signed   By: Myles Rosenthal M.D.   On: 09/28/2013 21:36     Microbiology: No results found for this or any previous visit (from the past 240 hour(s)).   Labs: Basic Metabolic Panel:  Recent Labs Lab 09/28/13 2040 09/29/13 0205  NA 139 139  K 4.4 3.8  CL 101 104  CO2 28  29  GLUCOSE 232* 146*  BUN 34* 34*  CREATININE 1.40* 1.21*  CALCIUM 9.9 9.2   Liver Function Tests:  Recent Labs Lab 09/28/13 2040  AST 46*  ALT 29  ALKPHOS 109  BILITOT 0.5  PROT 8.0  ALBUMIN 3.9   No results found for this basename: LIPASE, AMYLASE,  in the last 168 hours No results found for this basename: AMMONIA,  in the last 168 hours CBC:  Recent Labs Lab 09/28/13 2040 09/29/13 0205  WBC 4.7 4.1  HGB 11.5* 10.2*  HCT 35.1* 30.9*  MCV 92.4 92.5  PLT 154 130*   Cardiac Enzymes:  Recent Labs Lab 09/28/13 2040 09/29/13 0205 09/29/13 0755  TROPONINI <0.30 <0.30 <0.30   BNP: No components found with this basename: POCBNP,  CBG:  Recent Labs Lab 09/28/13 2254 09/29/13 0722 09/29/13 1248  GLUCAP 209* 137* 135*    Time coordinating discharge:  Greater than 30 minutes  Signed:  Jessicca Stitzer, DO [facility] Pager: [phone] 09/29/2013, 1:50 PM

## 2013-09-29 NOTE — Progress Notes (Signed)
  Echocardiogram 2D Echocardiogram has been performed.  Cathie Beams 09/29/2013, 12:14 PM

## 2013-09-29 NOTE — Progress Notes (Signed)
ANTICOAGULATION CONSULT NOTE - Follow-up  Pharmacy Consult for warfarin Indication: mechanical MVR  No Known Allergies  Patient Measurements: Height: 5\' 2"  (157.5 cm) Weight: 114 lb 3.2 oz (51.801 kg) IBW/kg (Calculated) : 50.1  Vital Signs: Temp: 98.4 F (36.9 C) (12/15 0526) Temp src: Oral (12/15 0526) BP: 125/58 mmHg (12/15 1003) Pulse Rate: 68 (12/15 1003)  Labs:  Recent Labs  09/28/13 2040 09/29/13 0205 09/29/13 0755 09/29/13 0948  HGB 11.5* 10.2*  --   --   HCT 35.1* 30.9*  --   --   PLT 154 130*  --   --   LABPROT 30.8*  --   --  32.8*  INR 3.10*  --   --  3.36*  CREATININE 1.40* 1.21*  --   --   TROPONINI <0.30 <0.30 <0.30  --     Estimated Creatinine Clearance: 30.3 ml/min (by C-G formula based on Cr of 1.21).  Assessment: 27 yof presented to the hospital with CP. She is on chronic coumadin for history of mechanical MVR performed in 2007. She verified that her INR goal is 2.5-3.5. INR today is at goal at 3.36. CBC is stable, no bleeding noted.   Goal of Therapy:  INR 2.5-3.5   Plan:  1. Warfarin 5mg  PO x 1 tonight (her normal home dose for Mondays) 2. Daily INR  Jarika Robben, Drake Leach 09/29/2013,11:10 AM

## 2013-09-29 NOTE — Progress Notes (Signed)
ANTICOAGULATION CONSULT NOTE - Initial Consult  Pharmacy Consult for Warfarin  Indication: Mechanical valve, mitral   No Known Allergies  Patient Measurements: Height: 5\' 2"  (157.5 cm) Weight: 114 lb 3.2 oz (51.801 kg) IBW/kg (Calculated) : 50.1  Vital Signs: Temp: 97.5 F (36.4 C) (12/14 2340) Temp src: Oral (12/14 2340) BP: 152/58 mmHg (12/14 2340) Pulse Rate: 60 (12/14 2340)  Labs:  Recent Labs  09/28/13 2040  HGB 11.5*  HCT 35.1*  PLT 154  LABPROT 30.8*  INR 3.10*  CREATININE 1.40*  TROPONINI <0.30    Estimated Creatinine Clearance: 26.2 ml/min (by C-G formula based on Cr of 1.4).   Medical History: Past Medical History  Diagnosis Date  . Hypertension   . Diabetes mellitus   . Coronary artery disease     Medications:  Warfarin PTA  Assessment: 79 y/o tx from [facility], INR 3.10 on admit, other labs as above.   Goal of Therapy:  INR 2.5-3.5 given mitral valve (f/u with pt to ensure this is her goal) Monitor platelets by anticoagulation protocol: Yes   Plan:  -PT/INR at 0500 -Unable to get med rec, f/u day shift for PTA dose -F/U goal INR with patient  Abran Duke 09/29/2013,2:01 AM

## 2013-09-29 NOTE — Progress Notes (Signed)
[facility] PROGRESS NOTE  Alexis Perry:[id number] DOB: [birth year] DOA: 09/28/2013 PCP: No primary provider on file.  Brief history 79 year old female with history of diabetes mellitus, hypertension, coronary disease, mitral valve replacement--on warfarin presents with 3 day history of chest discomfort. She states that the discomfort radiated to her left arm. There was no diaphoresis, dizziness, nausea, vomiting, syncope. Patient states that the chest discomfort occurred with activity and was relieved with some rest. She states that she goes to the "fitness center" 2-3 times per week. In the past month, she has noted decreased exercise endurance. Interestingly, the patient did take a dose of clonazepam on the day prior to admission which relieved her chest discomfort. She states that she has had coronary artery bypass graft x1 vessel. She had MVR at [facility] April 2007. In addition, she has been checking blood pressure at home and her monitor has been registering "error" in the heart rate.  In the ED, she was found to be in atrial fibrillation with HR in low 100s.  This was confirmed by EKG.  An EKG on 04/27/2011  suggested atrial fibrillation. Troponins have been negative x3. The patient remained hemodynamically stable and rate controlled.  Assessment/Plan: Chest discomfort -Patient has typical and atypical components -May also be partly related to anxiety -She has requested cardiology evaluation -Troponins negative x3 -Continue aspirin Atrial Fibrillation/history of MVR -Continue warfarin -INR 3.10 at the time of admission -Rate controlled -Continue carvedilol -Echocardiogram Diabetes mellitus type 2 -Patient states that hemoglobin A1c 6.8 in September 2014 -NovoLog sliding scale CKD stage III -Baseline creatinine 1.1-1.2 Hypertension -Continue carvedilol, losartan, HCTZ Hyperlipidemia -Continue statin  Family Communication:   Daughter at beside Disposition Plan:    Home when medically stable       Procedures/Studies: Dg Chest 2 View  09/28/2013   CLINICAL DATA:  Left-sided chest pain. Shortness of breath. Palpitations. Recent 30 lb weight loss.  EXAM: CHEST  2 VIEW  COMPARISON:  None.  FINDINGS: Moderate cardiomegaly noted. Both lungs are clear. No evidence of pleural effusion. Mild ectasia of the thoracic aorta noted. Previous median sternotomy noted.  IMPRESSION: Moderate cardiomegaly.  No active lung disease.   Electronically Signed   By: Myles Rosenthal M.D.   On: 09/28/2013 21:36         Subjective: Patient denies fevers, chills, chest discomfort, shortness breath, nausea, vomiting, diarrhea, dizziness, syncope. Denies any dysuria, abdominal pain, hematuria.  Objective: Filed Vitals:   09/28/13 2208 09/28/13 2340 09/29/13 0526 09/29/13 0700  BP: 127/60 152/58 141/63 162/83  Pulse: 78 60 67 73  Temp:  97.5 F (36.4 C) 98.4 F (36.9 C)   TempSrc:  Oral Oral   Resp: 25 15 15    Height:  5\' 2"  (1.575 m)    Weight:  51.801 kg (114 lb 3.2 oz)    SpO2: 100% 97% 100%    No intake or output data in the 24 hours ending 09/29/13 0817 Weight change:  Exam:   General:  Pt is alert, follows commands appropriately, not in acute distress  HEENT: No icterus, No thrush,[place]/AT  Cardiovascular: RRR, S1/S2, no rubs, no gallops  Respiratory: CTA bilaterally, no wheezing, no crackles, no rhonchi  Abdomen: Soft/+BS, non tender, non distended, no guarding  Extremities: trace LE edema, No lymphangitis, No petechiae, No rashes, no synovitis  Data Reviewed: Basic Metabolic Panel:  Recent Labs Lab 09/28/13 2040 09/29/13 0205  NA 139 139  K 4.4 3.8  CL 101 104  CO2 28 29  GLUCOSE 232* 146*  BUN 34* 34*  CREATININE 1.40* 1.21*  CALCIUM 9.9 9.2   Liver Function Tests:  Recent Labs Lab 09/28/13 2040  AST 46*  ALT 29  ALKPHOS 109  BILITOT 0.5  PROT 8.0  ALBUMIN 3.9   No results found for this basename: LIPASE, AMYLASE,  in the last  168 hours No results found for this basename: AMMONIA,  in the last 168 hours CBC:  Recent Labs Lab 09/28/13 2040 09/29/13 0205  WBC 4.7 4.1  HGB 11.5* 10.2*  HCT 35.1* 30.9*  MCV 92.4 92.5  PLT 154 130*   Cardiac Enzymes:  Recent Labs Lab 09/28/13 2040 09/29/13 0205  TROPONINI <0.30 <0.30   BNP: No components found with this basename: POCBNP,  CBG:  Recent Labs Lab 09/28/13 2254 09/29/13 0722  GLUCAP 209* 137*    No results found for this or any previous visit (from the past 240 hour(s)).   Scheduled Meds: . amLODipine  5 mg Oral Daily  . aspirin EC  81 mg Oral Daily  . brimonidine  1 drop Both Eyes TID  . calcium-vitamin D  1 tablet Oral Q breakfast  . carvedilol  25 mg Oral BID WC  . cycloSPORINE  1 drop Both Eyes BID  . ferrous sulfate  325 mg Oral Q breakfast  . gabapentin  300 mg Oral QHS  . losartan  100 mg Oral Daily   And  . hydrochlorothiazide  25 mg Oral Daily  . insulin aspart  0-9 Units Subcutaneous TID WC  . linagliptin  5 mg Oral Daily  . pantoprazole  40 mg Oral Daily  . pregabalin  150 mg Oral BID  . simvastatin  40 mg Oral QHS  . sodium chloride  3 mL Intravenous Q12H  . sodium chloride  3 mL Intravenous Q12H  . Travoprost (BAK Free)  1 drop Both Eyes QHS   Continuous Infusions:    Devri Kreher, DO  [facility] Pager [phone]  If 7PM-7AM, please contact night-coverage [email or web address] Password [email or web address] 09/29/2013, 8:17 AM   LOS: 1 day

## 2013-09-29 NOTE — Consult Note (Addendum)
Admit date: 09/28/2013 Referring Physician  Dr. Arbutus Leas Primary Physician No primary provider on file. Primary Cardiologist  Dr. Bary Castilla at [facility].  Reason for Consultation  AFIB and Chest pain  HPI: 79 year old female transferred from [facility] with chief complaint of chest pain. She has a history of mechanical mitral valve replacement, coronary artery disease, hypertension, diabetes who presented to the emergency department because of chest pain on 09/28/13. She had been experiencing this intermittently over the past several days and on Friday had experienced retrosternal pressure lasting for 15 minutes concerning to her. She may have had some pain radiating to her left arm which had resolved. This returned. Slight burning in quality. Mild shortness of breath. No diaphoresis. EKG demonstrated atrial fibrillation 106 beats per minute in the emergency department. Shee denied any previous knowledge of atrial fibrillation. She takes Klonopin for anxiety as well.  She had weight loss from 141 pounds to 112 pounds since 2012 and has had extensive evaluation at [facility] but denies a history of cancer. She also reports a history of 2 cardiac surgeries for mitral valve repair as well as single-vessel bypass in 2006. Her cardiologist is in [facility]. According to emergency room notes, she did not want to go to [facility].  Currently she is standing up, just finished brushing her teeth and feels well. She is quite comfortable, no complaints of any further discomfort. Troponins are normal. EKG unremarkable. See below for further discussion.   PMH:   Past Medical History  Diagnosis Date  . Hypertension   . Diabetes mellitus   . Coronary artery disease     PSH:   Past Surgical History  Procedure Laterality Date  . Cardiac surgery    . Mitral valve surgery     Allergies:  Review of patient's allergies indicates no known allergies. Prior to Admit Meds:   Prior to Admission  medications   Medication Sig Start Date End Date Taking? Authorizing Provider  alendronate (FOSAMAX) 70 MG tablet Take 70 mg by mouth once a week. Take with a full glass of water on an empty stomach.   Yes Historical Provider, MD  amLODipine (NORVASC) 10 MG tablet Take 5 mg by mouth daily.   Yes Historical Provider, MD  aspirin EC 81 MG tablet Take 81 mg by mouth daily.   Yes Historical Provider, MD  atorvastatin (LIPITOR) 10 MG tablet Take 10 mg by mouth every evening.   Yes Historical Provider, MD  brimonidine-timolol (COMBIGAN) 0.2-0.5 % ophthalmic solution Place 1 drop into both eyes every 12 (twelve) hours.   Yes Historical Provider, MD  carvedilol (COREG) 25 MG tablet Take 12.5 mg by mouth 2 (two) times daily with a meal.   Yes Historical Provider, MD  cholecalciferol (VITAMIN D) 1000 UNITS tablet Take 1,000 Units by mouth daily.   Yes Historical Provider, MD  clonazePAM (KLONOPIN) 1 MG tablet Take 0.5 mg by mouth as needed for anxiety.   Yes Historical Provider, MD  fexofenadine (ALLEGRA) 30 MG tablet Take 30 mg by mouth daily as needed (allergies).   Yes Historical Provider, MD  gabapentin (NEURONTIN) 300 MG capsule Take 300 mg by mouth every evening.   Yes Historical Provider, MD  losartan-hydrochlorothiazide (HYZAAR) 100-25 MG per tablet Take 0.5 tablets by mouth daily.   Yes Historical Provider, MD  lubiprostone (AMITIZA) 8 MCG capsule Take 8 mcg by mouth daily as needed for constipation.   Yes Historical Provider, MD  Multiple Vitamin (MULTIVITAMIN) tablet Take  1 tablet by mouth daily.   Yes Historical Provider, MD  OVER THE COUNTER MEDICATION Apply 1 drop to eye 2 (two) times daily as needed (dry eyes).   Yes Historical Provider, MD  pantoprazole (PROTONIX) 40 MG tablet Take 40 mg by mouth daily.   Yes Historical Provider, MD  Polyethyl Glycol-Propyl Glycol (SYSTANE OP) Apply 1-2 drops to eye as needed (dry eyes).   Yes Historical Provider, MD  saxagliptin HCl (ONGLYZA) 2.5 MG TABS  tablet Take 2.5 mg by mouth daily.   Yes Historical Provider, MD  senna (SENOKOT) 8.6 MG TABS tablet Take 2 tablets by mouth at bedtime as needed for mild constipation.   Yes Historical Provider, MD  traMADol (ULTRAM) 50 MG tablet Take 50 mg by mouth 2 (two) times daily as needed for moderate pain.   Yes Historical Provider, MD  travoprost, benzalkonium, (TRAVATAN) 0.004 % ophthalmic solution Place 1 drop into both eyes at bedtime.   Yes Historical Provider, MD  warfarin (COUMADIN) 5 MG tablet Take 5-7.5 mg by mouth daily. Takes 1 1/2 tablets (7.5mg ) daily except Monday takes 1 tablet (5mg )   Yes Historical Provider, MD   Fam HX:    Family History  Problem Relation Age of Onset  . Stroke Brother    Social HX:    History   Social History  . Marital Status: Widowed    Spouse Name: N/A    Number of Children: N/A  . Years of Education: N/A   Occupational History  . Not on file.   Social History Main Topics  . Smoking status: Never Smoker   . Smokeless tobacco: Not on file  . Alcohol Use: No  . Drug Use: No  . Sexual Activity: Not on file   Other Topics Concern  . Not on file   Social History Narrative  . No narrative on file     ROS:  All 11 ROS were addressed and are negative except what is stated in the HPI   Physical Exam: Blood pressure 125/58, pulse 68, temperature 98.4 F (36.9 C), temperature source Oral, resp. rate 15, height 5\' 2"  (1.575 m), weight 114 lb 3.2 oz (51.801 kg), SpO2 100.00%.   General: Thin, in no acute distress Head: Eyes PERRLA, No xanthomas.   Normal cephalic and atramatic  Lungs:   Clear bilaterally to auscultation and percussion. Normal respiratory effort. No wheezes, no rales. Heart:   Irregularly irregular S1 click S2 Pulses are 2+ & equal. 2/6 systolic murmur right upper sternal border, rubs, gallops.  No carotid bruit. No JVD.  No abdominal bruits.  Abdomen: Bowel sounds are positive, abdomen soft and non-tender without masses. No  hepatosplenomegaly. Msk:  Back normal. Normal strength and tone for age. Extremities:  No clubbing, cyanosis or edema.  DP +1 Neuro: Alert and oriented X 3, non-focal, MAE x 4 GU: Deferred Rectal: Deferred Psych:  Good affect, responds appropriately, mildly anxious      Labs: Lab Results  Component Value Date   WBC 4.1 09/29/2013   HGB 10.2* 09/29/2013   HCT 30.9* 09/29/2013   MCV 92.5 09/29/2013   PLT 130* 09/29/2013     Recent Labs Lab 09/28/13 2040 09/29/13 0205  NA 139 139  K 4.4 3.8  CL 101 104  CO2 28 29  BUN 34* 34*  CREATININE 1.40* 1.21*  CALCIUM 9.9 9.2  PROT 8.0  --   BILITOT 0.5  --   ALKPHOS 109  --   ALT 29  --  AST 46*  --   GLUCOSE 232* 146*    Recent Labs  09/28/13 2040 09/29/13 0205 09/29/13 0755  TROPONINI <0.30 <0.30 <0.30     Radiology:  Dg Chest 2 View  09/28/2013   CLINICAL DATA:  Left-sided chest pain. Shortness of breath. Palpitations. Recent 30 lb weight loss.  EXAM: CHEST  2 VIEW  COMPARISON:  None.  FINDINGS: Moderate cardiomegaly noted. Both lungs are clear. No evidence of pleural effusion. Mild ectasia of the thoracic aorta noted. Previous median sternotomy noted.  IMPRESSION: Moderate cardiomegaly.  No active lung disease.   Electronically Signed   By: Myles Rosenthal M.D.   On: 09/28/2013 21:36   Personally viewed.  EKG:  Atrial fibrillation heart rate 106 with nonspecific ST changes, LVH, left axis deviation. Personally viewed.   ASSESSMENT/PLAN:   79 year old female with coronary artery disease status post single vessel bypass surgery as well as mitral valve replacement, mechanical in 2006 here with newly discovered atrial fibrillation, chest pain, anxiety.  1. Chest pain-currently resolved. Feeling well. She has not had a stress test in a few years she states. Troponins were all normal. EKG does not show any worrisome signs of ischemia. She has an appointment on Wednesday with her cardiologist and I'm comfortable with her being  discharged with close followup with him. Given her lack of current symptoms, normal troponin, I do not feel strongly that she needs a nuclear stress test here in the hospital setting and as I said above, she does have excellent followup plan on Wednesday. She has been to several different hospitals in the past, ranging from [place], to [place], [place] and now here. She does keep her primary cardiologist however in [facility]. When asked about her reasoning not to go to [facility], she stated that she remembers the carpet there and was concerned about germs but now she knows that the carpet has been replaced. A family member of hers was recently here for stroke. I tried to reassure her.  2. Mechanical mitral valve-doing well, stable, anticoagulated  3. Atrial fibrillation-she does not remember having this diagnosis previously. Likely, she is anticoagulated with her mitral valve, mechanical. Current heart rates are fairly well controlled. She is currently on carvedilol 25 mg twice a day. In the lieu of amlodipine 5 mg, I will give her diltiazem CD 120 mg to further assist with rate control. She's not in any need of urgent cardioversion. She will be seeing her cardiologist on Wednesday. At that time, there may be consideration for restoration of normal rhythm. Perhaps, with rapid ventricular response, this may exacerbate some anginal like symptoms. Currently doing well.  4. Hyperlipidemia-continue with atorvastatin.  I'm comfortable with her being discharged. Once again, close follow up Wednesday with her cardiologist. She is also comfortable with this plan. On Wednesday, Dr. Bary Castilla can contemplate possible stress testing. If symptoms worsen or become a worrisome, she knows to return or seek medical attention.    Donato Schultz, MD  09/29/2013  10:35 AM   ECHO EF 35-40%  Distal anteroseptal and mid to distal lateral wall akinesis (likely old infarct) prior CABG Appears comfortable. Heart rate in 60's  during echo and at times appears sinus.  Instead of diltiazem CD 120, I would like her to be on Digoxin once a day.  This is in addition to her coreg 25 BID.  She will be seeing her cardiologist on Wed. Will send records.  Relayed message to Dr. Arbutus Leas of hospitalist team.   *  [place]* *[facility]* [address] [place], [place] [address] [phone]  ------------------------------------------------------------ Transthoracic Echocardiography  Patient: Briel, Gallicchio MR #: [id number] Study Date: 09/29/2013 Gender: F Age: 6 Height: 157.5cm Weight: 51.8kg BSA: 1.56m^2 Pt. Status: Room: [facility]  PERFORMING [facility], Echo SONOGRAPHER Cathie Beams ATTENDING Darleene Cleaver Tat, Daun Peacock cc:  ------------------------------------------------------------ LV EF: 35% - 40%  ------------------------------------------------------------ Indications: Atrial fibrillation - 427.31.  ------------------------------------------------------------ History: PMH: Mitral valve replacement. Chronic kidney disease. Chest pain. Coronary artery disease. Risk factors: Hypertension. Diabetes mellitus.  ------------------------------------------------------------ Study Conclusions  - Left ventricle: The cavity size was normal. Systolic function was moderately reduced. The estimated ejection fraction was in the range of 35% to 40%. There is akinesis of the distal anteroseptal, mid to distal anterolateral, and apical myocardium. - Mitral valve: A mechanical prosthesis was present and functioning normally. Valve area by pressure half-time: 2.18cm^2. - Left atrium: Anterior-posterior dimension: 35mm (2D). Transthoracic echocardiography. M-mode, complete 2D, spectral Doppler, and color Doppler. Height: Height: 157.5cm. Height: 62in. Weight: Weight: 51.8kg. Weight: 114lb. Body mass index: BMI: 20.9kg/m^2. Body surface area:  BSA: 1.37m^2. Blood pressure: 125/58. Patient status: Inpatient. Location: Echo laboratory.  ------------------------------------------------------------  ------------------------------------------------------------ Left ventricle: The cavity size was normal. Systolic function was moderately reduced. The estimated ejection fraction was in the range of 35% to 40%. Regional wall motion abnormalities: There is akinesis of the distal anteroseptal, mid to distal anterolateral, and apical myocardium.  ------------------------------------------------------------ Aortic valve: Mildly calcified leaflets. Cusp separation was normal. Doppler: Transvalvular velocity was within the normal range. There was no stenosis. No regurgitation.  ------------------------------------------------------------ Mitral valve: A mechanical prosthesis was present and functioning normally. Doppler: Valve area by pressure half-time: 2.18cm^2. Indexed valve area by pressure half-time: 1.45cm^2/m^2. Mean gradient: 6mm Hg (D). Peak gradient: 16mm Hg (D).  ------------------------------------------------------------ Left atrium: The atrium was normal in size.  ------------------------------------------------------------ Right ventricle: The cavity size was normal. Wall thickness was normal. Systolic function was normal.  ------------------------------------------------------------ Pulmonic valve: Poorly visualized. Structurally normal valve. Cusp separation was normal. Doppler: Transvalvular velocity was within the normal range. No regurgitation.  ------------------------------------------------------------ Tricuspid valve: Structurally normal valve. Leaflet separation was normal. Doppler: Transvalvular velocity was within the normal range. No regurgitation.  ------------------------------------------------------------ Pulmonary artery: The main pulmonary artery  was normal-sized.  ------------------------------------------------------------ Right atrium: The atrium was normal in size.  ------------------------------------------------------------ Pericardium: The pericardium was normal in appearance. There was no pericardial effusion.  ------------------------------------------------------------ Systemic veins: Inferior vena cava: The vessel was normal in size; the respirophasic diameter changes were in the normal range (= 50%); findings are consistent with normal central venous pressure.  ------------------------------------------------------------  2D measurements Normal Doppler measurements Norma Left ventricle l LVID ED, 47.2 mm 43-52 Mitral valve chord, Mean vel, 105 cm/s ----- PLAX D LVID ES, 34.3 mm 23-38 Pressure 101 ms ----- chord, half-time PLAX Mean 6 mm Hg ----- FS, chord, 27 % >29 gradient, PLAX D LVPW, ED 10.2 mm ------ Peak 16 mm Hg ----- IVS/LVPW 1.03 <1.3 gradient, ratio, ED D Ventricular septum Area 2.1 cm^2 ----- IVS, ED 10.5 mm ------ (PHT) 8 Aorta Area 1.4 cm^2/m^2 ----- Root diam, 28 mm ------ index 5 ED (PHT) Left atrium Annulus 41. cm ----- AP dim 35 mm ------ VTI 7 AP dim 2.32 cm/m^2 <2.2 index  ------------------------------------------------------------ Prepared and Electronically Authenticated by  Donato Schultz 2014-12-15T13:35:[id number]

## 2014-03-26 ENCOUNTER — Emergency Department (HOSPITAL_BASED_OUTPATIENT_CLINIC_OR_DEPARTMENT_OTHER)
Admission: EM | Admit: 2014-03-26 | Discharge: 2014-03-26 | Disposition: A | Discharge status: Home / Self Care | Payer: Medicare Other | Attending: Emergency Medicine | Admitting: Emergency Medicine

## 2014-03-26 ENCOUNTER — Encounter (HOSPITAL_BASED_OUTPATIENT_CLINIC_OR_DEPARTMENT_OTHER): Payer: Self-pay | Admitting: Emergency Medicine

## 2014-03-26 DIAGNOSIS — Z7982 Long term (current) use of aspirin: Secondary | ICD-10-CM | POA: Insufficient documentation

## 2014-03-26 DIAGNOSIS — K08409 Partial loss of teeth, unspecified cause, unspecified class: Secondary | ICD-10-CM

## 2014-03-26 DIAGNOSIS — Z79899 Other long term (current) drug therapy: Secondary | ICD-10-CM | POA: Insufficient documentation

## 2014-03-26 DIAGNOSIS — K08109 Complete loss of teeth, unspecified cause, unspecified class: Secondary | ICD-10-CM | POA: Insufficient documentation

## 2014-03-26 DIAGNOSIS — M129 Arthropathy, unspecified: Secondary | ICD-10-CM | POA: Insufficient documentation

## 2014-03-26 DIAGNOSIS — Z7901 Long term (current) use of anticoagulants: Secondary | ICD-10-CM | POA: Insufficient documentation

## 2014-03-26 DIAGNOSIS — E119 Type 2 diabetes mellitus without complications: Secondary | ICD-10-CM | POA: Insufficient documentation

## 2014-03-26 DIAGNOSIS — Z9889 Other specified postprocedural states: Secondary | ICD-10-CM | POA: Insufficient documentation

## 2014-03-26 DIAGNOSIS — I10 Essential (primary) hypertension: Secondary | ICD-10-CM | POA: Insufficient documentation

## 2014-03-26 DIAGNOSIS — I2510 Atherosclerotic heart disease of native coronary artery without angina pectoris: Secondary | ICD-10-CM | POA: Insufficient documentation

## 2014-03-26 MED ORDER — OXYCODONE-ACETAMINOPHEN 5-325 MG PO TABS: 1.0000 | ORAL_TABLET | ORAL | Status: DC | PRN

## 2014-03-26 MED ORDER — OXYCODONE-ACETAMINOPHEN 5-325 MG PO TABS
2.0000 | ORAL_TABLET | Freq: Once | ORAL | Status: AC
  Administered 2014-03-26: 2 via ORAL
  Filled 2014-03-26: qty 2

## 2014-03-26 NOTE — ED Notes (Signed)
Tooth extraction today and the pain is more than she can tolerate.  Sts she took Tramadol at 4:30 but it did not help.

## 2014-03-26 NOTE — ED Provider Notes (Signed)
CSN: [id number]     Arrival date & time 03/26/14  1915 History   First MD Initiated Contact with Patient 03/26/14 2106     This chart was scribed for Hilario Quarry, MD by Arlan Organ, ED Scribe. This patient was seen in room MH07/MH07 and the patient's care was started 9:08 PM.   Chief Complaint  Patient presents with  . Dental Pain   The history is provided by the patient. No language interpreter was used.    HPI Comments: EILLEN COUCHMAN is a 80 y.o. female with a PMHx of HTN, DM, and coronary artery disease who presents to the Emergency Department complaining of constant, moderate oral pain onset today. Pt had a tooth extracted today and is experiencing ongoing worsening pain. She has tried prescribed Tramadol today in the morning and around 4:30 without any noticeable improvement. Pt states she takes Tramadol 50 mg daily for arthritis and was not given this prescription today from her dentist. She denies any fever, chills, or difficulty swallowing. She has no other pertinent past medical history. No other concerns this visit.  Past Medical History  Diagnosis Date  . Hypertension   . Diabetes mellitus   . Coronary artery disease    Past Surgical History  Procedure Laterality Date  . Cardiac surgery    . Mitral valve surgery     Family History  Problem Relation Age of Onset  . Stroke Brother    History  Substance Use Topics  . Smoking status: Never Smoker   . Smokeless tobacco: Not on file  . Alcohol Use: No   OB History   Grav Para Term Preterm Abortions TAB SAB Ect Mult Living                 Review of Systems  Constitutional: Negative for fever and chills.  HENT: Positive for dental problem. Negative for congestion.   Eyes: Negative for redness.  Respiratory: Negative for cough.   Skin: Negative for rash.  Psychiatric/Behavioral: Negative for confusion.      Allergies  Review of patient's allergies indicates no known allergies.  Home Medications   Prior  to Admission medications   Medication Sig Start Date End Date Taking? Authorizing Provider  alendronate (FOSAMAX) 70 MG tablet Take 70 mg by mouth once a week. Take with a full glass of water on an empty stomach.   Yes Historical Provider, MD  amLODipine (NORVASC) 10 MG tablet Take 10 mg by mouth daily.   Yes Historical Provider, MD  aspirin EC 81 MG tablet Take 81 mg by mouth daily.   Yes Historical Provider, MD  atorvastatin (LIPITOR) 10 MG tablet Take 10 mg by mouth every evening.   Yes Historical Provider, MD  brimonidine-timolol (COMBIGAN) 0.2-0.5 % ophthalmic solution Place 1 drop into both eyes every 12 (twelve) hours.   Yes Historical Provider, MD  carvedilol (COREG) 25 MG tablet Take 12.5 mg by mouth 2 (two) times daily with a meal.   Yes Historical Provider, MD  cholecalciferol (VITAMIN D) 1000 UNITS tablet Take 1,000 Units by mouth daily.   Yes Historical Provider, MD  clonazePAM (KLONOPIN) 1 MG tablet Take 0.5 mg by mouth as needed for anxiety.   Yes Historical Provider, MD  gabapentin (NEURONTIN) 300 MG capsule Take 300 mg by mouth every evening.   Yes Historical Provider, MD  losartan-hydrochlorothiazide (HYZAAR) 100-25 MG per tablet Take 0.5 tablets by mouth daily.   Yes Historical Provider, MD  metFORMIN (GLUCOPHAGE) 500 MG tablet  Take by mouth daily.   Yes Historical Provider, MD  Multiple Vitamin (MULTIVITAMIN) tablet Take 1 tablet by mouth daily.   Yes Historical Provider, MD  pantoprazole (PROTONIX) 40 MG tablet Take 40 mg by mouth daily.   Yes Historical Provider, MD  traMADol (ULTRAM) 50 MG tablet Take 50 mg by mouth 2 (two) times daily as needed for moderate pain.   Yes Historical Provider, MD  travoprost, benzalkonium, (TRAVATAN) 0.004 % ophthalmic solution Place 1 drop into both eyes at bedtime.   Yes Historical Provider, MD  warfarin (COUMADIN) 5 MG tablet Take 5-7.5 mg by mouth daily. Takes 1 1/2 tablets (7.5mg ) daily except Monday takes 1 tablet (5mg )   Yes Historical  Provider, MD  digoxin (LANOXIN) 0.125 MG tablet Take 1 tablet (0.125 mg total) by mouth daily. 09/29/13   Catarina Hartshornavid Tat, MD  fexofenadine (ALLEGRA) 30 MG tablet Take 30 mg by mouth daily as needed (allergies).    Historical Provider, MD  lubiprostone (AMITIZA) 8 MCG capsule Take 8 mcg by mouth daily as needed for constipation.    Historical Provider, MD  OVER THE COUNTER MEDICATION Apply 1 drop to eye 2 (two) times daily as needed (dry eyes).    Historical Provider, MD  Polyethyl Glycol-Propyl Glycol (SYSTANE OP) Apply 1-2 drops to eye as needed (dry eyes).    Historical Provider, MD  saxagliptin HCl (ONGLYZA) 2.5 MG TABS tablet Take 2.5 mg by mouth daily.    Historical Provider, MD  senna (SENOKOT) 8.6 MG TABS tablet Take 2 tablets by mouth at bedtime as needed for mild constipation.    Historical Provider, MD   Triage Vitals: BP 186/72  Pulse 68  Temp(Src) 98 F (36.7 C) (Oral)  Resp 16  Ht 5\' 2"  (1.575 m)  Wt 113 lb (51.256 kg)  BMI 20.66 kg/m2  SpO2 99%   Physical Exam  Nursing note and vitals reviewed. Constitutional: She is oriented to person, place, and time. She appears well-developed and well-nourished. No distress.  HENT:  Head: Normocephalic and atraumatic.  L first molar was removed Isolated pain where tooth was extracted  Eyes: EOM are normal.  Neck: Normal range of motion.  Cardiovascular: Normal rate, regular rhythm and normal heart sounds.   Pulmonary/Chest: Effort normal and breath sounds normal.  Abdominal: Soft. She exhibits no distension. There is no tenderness.  Musculoskeletal: Normal range of motion.  Neurological: She is alert and oriented to person, place, and time.  Skin: Skin is warm and dry.  Psychiatric: She has a normal mood and affect. Judgment normal.    ED Course  Procedures (including critical care time)  DIAGNOSTIC STUDIES: Oxygen Saturation is 99% on RA, Normal by my interpretation.    COORDINATION OF CARE: 9:08 PM- Will give pain medication  and prescribe short course at discharge to manage symptoms. Discussed treatment plan with pt at bedside and pt agreed to plan.     Labs Review Labs Reviewed - No data to display  Imaging Review No results found.   EKG Interpretation None      MDM   Final diagnoses:  Status post tooth extraction    I personally performed the services described in this documentation, which was scribed in my presence. The recorded information has been reviewed and considered.     Hilario Quarryanielle S Newton Frutiger, MD 03/26/14 2151

## 2014-03-26 NOTE — Discharge Instructions (Signed)
Dental Pain °Toothache is pain in or around a tooth. It may get worse with chewing or with cold or heat.  °HOME CARE °· Your dentist may use a numbing medicine during treatment. If so, you may need to avoid eating until the medicine wears off. Ask your dentist about this. °· Only take medicine as told by your dentist or doctor. °· Avoid chewing food near the painful tooth until after all treatment is done. Ask your dentist about this. °GET HELP RIGHT AWAY IF:  °· The problem gets worse or new problems appear. °· You have a fever. °· There is redness and puffiness (swelling) of the face, jaw, or neck. °· You cannot open your mouth. °· There is pain in the jaw. °· There is very bad pain that is not helped by medicine. °MAKE SURE YOU:  °· Understand these instructions. °· Will watch your condition. °· Will get help right away if you are not doing well or get worse. °Document Released: 03/20/2008 Document Revised: 12/25/2011 Document Reviewed: 03/20/2008 °[employer]® Patient Information ©2014 [employer]. ° °

## 2014-03-26 NOTE — ED Notes (Signed)
MD at bedside. 

## 2014-03-26 NOTE — ED Notes (Signed)
Pt reports having tooth pulled today.  Sts having increased pain and wasn't given any pain medication.

## 2014-10-25 ENCOUNTER — Emergency Department (HOSPITAL_BASED_OUTPATIENT_CLINIC_OR_DEPARTMENT_OTHER): Payer: Medicare HMO

## 2014-10-25 ENCOUNTER — Emergency Department (HOSPITAL_BASED_OUTPATIENT_CLINIC_OR_DEPARTMENT_OTHER)
Admission: EM | Admit: 2014-10-25 | Discharge: 2014-10-25 | Disposition: A | Discharge status: Home / Self Care | Payer: Medicare HMO | Attending: Emergency Medicine | Admitting: Emergency Medicine

## 2014-10-25 ENCOUNTER — Encounter (HOSPITAL_BASED_OUTPATIENT_CLINIC_OR_DEPARTMENT_OTHER): Payer: Self-pay | Admitting: *Deleted

## 2014-10-25 DIAGNOSIS — J159 Unspecified bacterial pneumonia: Secondary | ICD-10-CM | POA: Diagnosis not present

## 2014-10-25 DIAGNOSIS — Z792 Long term (current) use of antibiotics: Secondary | ICD-10-CM | POA: Diagnosis not present

## 2014-10-25 DIAGNOSIS — I10 Essential (primary) hypertension: Secondary | ICD-10-CM | POA: Diagnosis not present

## 2014-10-25 DIAGNOSIS — Z7982 Long term (current) use of aspirin: Secondary | ICD-10-CM | POA: Insufficient documentation

## 2014-10-25 DIAGNOSIS — D649 Anemia, unspecified: Secondary | ICD-10-CM

## 2014-10-25 DIAGNOSIS — J189 Pneumonia, unspecified organism: Secondary | ICD-10-CM

## 2014-10-25 DIAGNOSIS — Z7901 Long term (current) use of anticoagulants: Secondary | ICD-10-CM

## 2014-10-25 DIAGNOSIS — Z79899 Other long term (current) drug therapy: Secondary | ICD-10-CM | POA: Diagnosis not present

## 2014-10-25 DIAGNOSIS — J918 Pleural effusion in other conditions classified elsewhere: Secondary | ICD-10-CM

## 2014-10-25 DIAGNOSIS — I2510 Atherosclerotic heart disease of native coronary artery without angina pectoris: Secondary | ICD-10-CM | POA: Diagnosis not present

## 2014-10-25 DIAGNOSIS — E119 Type 2 diabetes mellitus without complications: Secondary | ICD-10-CM | POA: Insufficient documentation

## 2014-10-25 DIAGNOSIS — R05 Cough: Secondary | ICD-10-CM | POA: Diagnosis present

## 2014-10-25 DIAGNOSIS — R52 Pain, unspecified: Secondary | ICD-10-CM

## 2014-10-25 LAB — CBC
HCT: 33.5 % — ABNORMAL LOW (ref 36.0–46.0)
HEMOGLOBIN: 10.9 g/dL — AB (ref 12.0–15.0)
MCH: 29.5 pg (ref 26.0–34.0)
MCHC: 32.5 g/dL (ref 30.0–36.0)
MCV: 90.8 fL (ref 78.0–100.0)
Platelets: 226 10*3/uL (ref 150–400)
RBC: 3.69 MIL/uL — ABNORMAL LOW (ref 3.87–5.11)
RDW: 12.7 % (ref 11.5–15.5)
WBC: 7.9 10*3/uL (ref 4.0–10.5)

## 2014-10-25 LAB — PROTIME-INR
INR: 3.78 — AB (ref 0.00–1.49)
Prothrombin Time: 37.3 seconds — ABNORMAL HIGH (ref 11.6–15.2)

## 2014-10-25 LAB — BASIC METABOLIC PANEL
ANION GAP: 6 (ref 5–15)
BUN: 29 mg/dL — ABNORMAL HIGH (ref 6–23)
CALCIUM: 9.3 mg/dL (ref 8.4–10.5)
CO2: 26 mmol/L (ref 19–32)
Chloride: 103 mEq/L (ref 96–112)
Creatinine, Ser: 1.13 mg/dL — ABNORMAL HIGH (ref 0.50–1.10)
GFR calc Af Amer: 52 mL/min — ABNORMAL LOW (ref >90)
GFR calc non Af Amer: 45 mL/min — ABNORMAL LOW (ref >90)
Glucose, Bld: 212 mg/dL — ABNORMAL HIGH (ref 70–99)
Potassium: 4.3 mmol/L (ref 3.5–5.1)
Sodium: 135 mmol/L (ref 135–145)

## 2014-10-25 LAB — BRAIN NATRIURETIC PEPTIDE: B Natriuretic Peptide: 238.6 pg/mL — ABNORMAL HIGH (ref 0.0–100.0)

## 2014-10-25 LAB — CBG MONITORING, ED: Glucose-Capillary: 215 mg/dL — ABNORMAL HIGH (ref 70–99)

## 2014-10-25 LAB — TROPONIN I: Troponin I: 0.03 ng/mL (ref <0.031)

## 2014-10-25 MED ORDER — AZITHROMYCIN 250 MG PO TABS
500.0000 mg | ORAL_TABLET | Freq: Once | ORAL | Status: AC
  Administered 2014-10-25: 500 mg via ORAL
  Filled 2014-10-25: qty 2

## 2014-10-25 MED ORDER — FUROSEMIDE 40 MG PO TABS
40.0000 mg | ORAL_TABLET | Freq: Once | ORAL | Status: AC
  Administered 2014-10-25: 40 mg via ORAL
  Filled 2014-10-25: qty 1

## 2014-10-25 MED ORDER — FUROSEMIDE 40 MG PO TABS: 40.0000 mg | ORAL_TABLET | Freq: Every day | ORAL | Status: DC

## 2014-10-25 MED ORDER — AZITHROMYCIN 250 MG PO TABS: 250.0000 mg | ORAL_TABLET | Freq: Every day | ORAL | Status: AC

## 2014-10-25 NOTE — ED Provider Notes (Signed)
CSN: [id number]     Arrival date & time 10/25/14  4098 History   First MD Initiated Contact with Patient 10/25/14 [phone]     Chief Complaint  Patient presents with  . Cough     (Consider location/radiation/quality/duration/timing/severity/associated sxs/prior Treatment) HPI  This is a 81 year old female who comes in today complaining of some left lateral chest pain. She states that it began last Sunday. She thought that it was musculoskeletal or occurring due to some work she had done the day before. It began last Sunday after church. She was seen in her primary care physician's office on Wednesday and had a chest x-Malie Kashani done at that time. She states she has had a chronic cough and does not feel that she has had any real change in the cough. She is not a smoker nor she a smoker in the past. She received a chest x-Sheyna Pettibone and her primary care physician's office and was called later that day and told that there was a possibility of infection and was started on Augmentin. She states that she is concerned about this diagnosis and wants to follow up on it. She feels that her dyspnea has been at baseline except for today when she had some increased dyspnea with reclining. She has dyspnea on exertion but this is at baseline. She has had a history of a mitral valve replacement and is on Coumadin secondary to this. She denies any other pain in the central chest or anterior chest. She describes the pain as being sharp and in the left lower anterolateral axillary region. She describes worsening with coughing and movement. She has not had any direct trauma to this area. Eyes any headache, neck pain, anterior chest pain, abdominal pain, nausea, vomiting, diarrhea, or fever. She is not having any weakness or vision changes.  Past Medical History  Diagnosis Date  . Hypertension   . Diabetes mellitus   . Coronary artery disease    Past Surgical History  Procedure Laterality Date  . Cardiac surgery    . Mitral valve  surgery     Family History  Problem Relation Age of Onset  . Stroke Brother    History  Substance Use Topics  . Smoking status: Never Smoker   . Smokeless tobacco: Not on file  . Alcohol Use: No   OB History    No data available     Review of Systems  All other systems reviewed and are negative.     Allergies  Review of patient's allergies indicates no known allergies.  Home Medications   Prior to Admission medications   Medication Sig Start Date End Date Taking? Authorizing Provider  amoxicillin-clavulanate (AUGMENTIN) 875-125 MG per tablet Take 1 tablet by mouth 2 (two) times daily.   Yes Historical Provider, MD  cholecalciferol (VITAMIN D) 1000 UNITS tablet Take 1,000 Units by mouth daily.   Yes Historical Provider, MD  gabapentin (NEURONTIN) 300 MG capsule Take 300 mg by mouth every evening.   Yes Historical Provider, MD  glipiZIDE (GLUCOTROL XL) 2.5 MG 24 hr tablet Take 2.5 mg by mouth daily with breakfast.   Yes Historical Provider, MD  warfarin (COUMADIN) 5 MG tablet Take 5-7.5 mg by mouth daily. Takes 1 1/2 tablets (7.5mg ) daily except Monday takes 1 tablet ( )   Yes Historical Provider, MD  alendronate (FOSAMAX) 70 MG tablet Take 70 mg by mouth once a week. Take with a full glass of water on an empty stomach.    Historical Provider, MD  amLODipine (NORVASC) 10 MG tablet Take 5 mg by mouth daily.     Historical Provider, MD  aspirin EC 81 MG tablet Take 81 mg by mouth daily.    Historical Provider, MD  atorvastatin (LIPITOR) 10 MG tablet Take 10 mg by mouth every evening.    Historical Provider, MD  brimonidine-timolol (COMBIGAN) 0.2-0.5 % ophthalmic solution Place 1 drop into both eyes every 12 (twelve) hours.    Historical Provider, MD  carvedilol (COREG) 25 MG tablet Take 12.5 mg by mouth 2 (two) times daily with a meal.    Historical Provider, MD  clonazePAM (KLONOPIN) 1 MG tablet Take 0.5 mg by mouth as needed for anxiety.    Historical Provider, MD    fexofenadine (ALLEGRA) 30 MG tablet Take 30 mg by mouth daily as needed (allergies).    Historical Provider, MD  losartan-hydrochlorothiazide (HYZAAR) 100-25 MG per tablet Take 0.5 tablets by mouth daily.    Historical Provider, MD  lubiprostone (AMITIZA) 8 MCG capsule Take 8 mcg by mouth daily as needed for constipation.    Historical Provider, MD  metFORMIN (GLUCOPHAGE) 500 MG tablet Take 750 mg by mouth daily.     Historical Provider, MD  Multiple Vitamin (MULTIVITAMIN) tablet Take 1 tablet by mouth daily.    Historical Provider, MD  OVER THE COUNTER MEDICATION Apply 1 drop to eye 2 (two) times daily as needed (dry eyes).    Historical Provider, MD  pantoprazole (PROTONIX) 40 MG tablet Take 40 mg by mouth daily.    Historical Provider, MD  Polyethyl Glycol-Propyl Glycol (SYSTANE OP) Apply 1-2 drops to eye as needed (dry eyes).    Historical Provider, MD  senna (SENOKOT) 8.6 MG TABS tablet Take 2 tablets by mouth at bedtime as needed for mild constipation.    Historical Provider, MD  travoprost, benzalkonium, (TRAVATAN) 0.004 % ophthalmic solution Place 1 drop into both eyes at bedtime.    Historical Provider, MD   BP 165/73 mmHg  Pulse 78  Temp(Src) 97.8 F (36.6 C) (Oral)  Resp 18  Ht  (1.575 m)  Wt 117 lb (53.071 kg)  BMI 21.39 kg/m2  SpO2 96% Physical Exam  Constitutional: She is oriented to person, place, and time. She appears well-developed and well-nourished.  HENT:  Head: Normocephalic and atraumatic.  Right Ear: External ear normal.  Left Ear: External ear normal.  Nose: Nose normal.  Mouth/Throat: Oropharynx is clear and moist.  Eyes: Conjunctivae and EOM are normal. Pupils are equal, round, and reactive to light.  Neck: Normal range of motion. Neck supple.  Cardiovascular: Normal rate, regular rhythm, normal heart sounds and intact distal pulses.   Pulmonary/Chest: Effort normal and breath sounds normal.  Abdominal: Soft. Bowel sounds are normal. There is  tenderness.    Musculoskeletal: Normal range of motion.  Neurological: She is alert and oriented to person, place, and time. She has normal reflexes.  Skin: Skin is warm and dry.  Psychiatric: She has a normal mood and affect. Her behavior is normal. Judgment and thought content normal.  Nursing note and vitals reviewed.   ED Course  Procedures (including critical care time) Labs Review Labs Reviewed  BASIC METABOLIC PANEL - Abnormal; Notable for the following:    Glucose, Bld 212 (*)    BUN 29 (*)    Creatinine, Ser 1.13 (*)    GFR calc non Af Amer 45 (*)    GFR calc Af Amer 52 (*)    All other components within normal limits  CBC - Abnormal; Notable for the following:    RBC 3.69 (*)    Hemoglobin 10.9 (*)    HCT 33.5 (*)    All other components within normal limits  PROTIME-INR - Abnormal; Notable for the following:    Prothrombin Time 37.3 (*)    INR 3.78 (*)    All other components within normal limits  BRAIN NATRIURETIC PEPTIDE - Abnormal; Notable for the following:    B Natriuretic Peptide 238.6 (*)    All other components within normal limits  CBG MONITORING, ED - Abnormal; Notable for the following:    Glucose-Capillary 215 (*)    All other components within normal limits  TROPONIN I    Imaging Review Dg Ribs Unilateral W/chest Left  10/25/2014   CLINICAL DATA:  Seven day history of cough with left-sided rib pain  EXAM: LEFT RIBS AND CHEST - 3+ VIEW  COMPARISON:  September 28, 2013  FINDINGS: Frontal chest radiograph as well as oblique and cone-down lower rib images were obtained.  There is underlying emphysema. There is infiltrate in the right lower lobe. There is slight scarring in the left mid lung region.  Heart is mildly enlarged with pulmonary vascularity within normal limits. No adenopathy.  There is no pneumothorax. There is, however, a small left pleural effusion. There is no demonstrable rib fracture. There is lumbar dextroscoliosis with extensive lumbar  spine arthropathy.  IMPRESSION: Small left pleural effusion. No rib fracture seen. No pneumothorax apparent. There is right lower lobe infiltrate. There is underlying emphysema with cardiomegaly.   Electronically Signed   By: Bretta Bang M.D.   On: 10/25/2014 10:19     EKG Interpretation   Date/Time:  Sunday October 25 2014 09:20:09 EST Ventricular Rate:  67 PR Interval:    QRS Duration: 124 QT Interval:  434 QTC Calculation: 458 R Axis:   -22 Text Interpretation:  Atrial fibrillation Left ventricular hypertrophy  with QRS widening Septal infarct , age undetermined Lateral infarct , age  undetermined Abnormal ECG No significant change since 04/26/13 although  intervening ekg with afib with rvr Confirmed by Min Tunnell MD, Duwayne Heck [id number])  on 10/25/2014 9:33:18 AM      MDM   Final diagnoses:  Pain  CAP (community acquired pneumonia)  Pleural effusion associated with pulmonary infection  Chronic anticoagulation  Chronic anemia    This is a 81 year old female with known coronary artery disease status post mitral valve on Coumadin for chronic atrial fibrillation presents today for recheck after being diagnosed last week with pneumonia at her doctor's office. Here her chest x-Eliam Snapp shows a lower lobe infiltrate and a mild left-sided pleural effusion. She is on Augmentin. I will add Zithromax for coverage of atypicals. Clinically she feels improved and is not running a fever. Cough is at baseline for her. She has some mild dyspnea but oxygen saturation saturations are in the upper 90s here with a normal respiratory rate.   1 community acquired pneumonia patient on current therapy and improving- left side chest pain likely from small pleural effusion 2 chronic atrial fibrillation EKG is abnormal but is stable from prior patient does not report any chest pain concerning for cardiac origin and troponin is normal 3 anticoagulation patient is on chronic Coumadin for atrial fibrillation and mitral  valve. Normally her Coumadin is kept between 2.5-3.5. Today her INR is elevated at 3.73. I discussed changing her Coumadin dosage and alternating her 5 mg pills with some 0.5 mg pills and vice versa the days  that she takes 7.5 mg 25 mg pills. She is to have her INR level be checked this Thursday at her cardiologist office 4 stable anemia 5 renal function appears stable. Patient will be given a small dose of Lasix to see if she diuresis any of the lung with slight effusion. However, she'll be given 2 doses as we do not want to cause her to have volume depletion. She has an appointment for follow-up with her primary care physician on Tuesday and she has been instructed to keep this. She is also instructed regarding return precautions and need for close follow-up. I discussed the return precautions but the patient and her daughter they voice understanding and agreement with plan.  Hilario Quarryanielle S Kobe Ofallon, MD 10/25/14 [phone]

## 2014-10-25 NOTE — Discharge Instructions (Signed)
° °  1 community acquired pneumonia patient on current therapy and improving- left side chest pain likely from small pleural effusion 2 chronic atrial fibrillation EKG is abnormal but is stable from prior patient does not report any chest pain concerning for cardiac origin and troponin is normal 3 anticoagulation patient is on chronic Coumadin for atrial fibrillation and mitral valve. Normally her Coumadin is kept between 2.5-3.5. Today her INR is elevated at 3.73. I discussed changing her Coumadin dosage and alternating her 5 mg pills with some 0.5 mg pills and vice versa the days that she takes 7.5 mg 25 mg pills. She is to have her INR level be checked this Thursday at her cardiologist office 4 stable anemia 5 renal function appears stable. Patient will be given a small dose of Lasix to see if she diuresis any of the lung with slight effusion. However, she'll be given 2 doses as we do not want to cause her to have volume depletion. She has an appointment for follow-up with her primary care physician on Tuesday and she has been instructed to keep this. She is also instructed regarding return precautions and need for close follow-up. I discussed the return precautions but the patient and her daughter they voice understanding and agreement with plan.

## 2014-10-25 NOTE — ED Notes (Signed)
Patient has had a cough for at least a week, saw her dr and was given Augmentin for pneumonia. Feels like the cough is worsening with left rib area pain, especially when she coughs.

## 2015-01-16 ENCOUNTER — Emergency Department (HOSPITAL_BASED_OUTPATIENT_CLINIC_OR_DEPARTMENT_OTHER): Payer: Medicare HMO

## 2015-01-16 ENCOUNTER — Encounter (HOSPITAL_BASED_OUTPATIENT_CLINIC_OR_DEPARTMENT_OTHER): Payer: Self-pay | Admitting: Emergency Medicine

## 2015-01-16 ENCOUNTER — Other Ambulatory Visit (HOSPITAL_BASED_OUTPATIENT_CLINIC_OR_DEPARTMENT_OTHER): Payer: Self-pay

## 2015-01-16 ENCOUNTER — Observation Stay (HOSPITAL_BASED_OUTPATIENT_CLINIC_OR_DEPARTMENT_OTHER)
Admission: EM | Admit: 2015-01-16 | Discharge: 2015-01-19 | Disposition: A | Discharge status: Home / Self Care | Payer: Medicare HMO | Attending: Internal Medicine | Admitting: Internal Medicine

## 2015-01-16 DIAGNOSIS — I252 Old myocardial infarction: Secondary | ICD-10-CM | POA: Diagnosis not present

## 2015-01-16 DIAGNOSIS — Z7982 Long term (current) use of aspirin: Secondary | ICD-10-CM | POA: Diagnosis not present

## 2015-01-16 DIAGNOSIS — F419 Anxiety disorder, unspecified: Secondary | ICD-10-CM | POA: Insufficient documentation

## 2015-01-16 DIAGNOSIS — E119 Type 2 diabetes mellitus without complications: Secondary | ICD-10-CM | POA: Diagnosis not present

## 2015-01-16 DIAGNOSIS — K219 Gastro-esophageal reflux disease without esophagitis: Secondary | ICD-10-CM | POA: Insufficient documentation

## 2015-01-16 DIAGNOSIS — R7989 Other specified abnormal findings of blood chemistry: Secondary | ICD-10-CM

## 2015-01-16 DIAGNOSIS — N183 Chronic kidney disease, stage 3 unspecified: Secondary | ICD-10-CM | POA: Diagnosis present

## 2015-01-16 DIAGNOSIS — Z7901 Long term (current) use of anticoagulants: Secondary | ICD-10-CM | POA: Insufficient documentation

## 2015-01-16 DIAGNOSIS — Z952 Presence of prosthetic heart valve: Secondary | ICD-10-CM

## 2015-01-16 DIAGNOSIS — I160 Hypertensive urgency: Secondary | ICD-10-CM | POA: Diagnosis present

## 2015-01-16 DIAGNOSIS — F411 Generalized anxiety disorder: Secondary | ICD-10-CM | POA: Diagnosis present

## 2015-01-16 DIAGNOSIS — I2510 Atherosclerotic heart disease of native coronary artery without angina pectoris: Secondary | ICD-10-CM | POA: Diagnosis not present

## 2015-01-16 DIAGNOSIS — I10 Essential (primary) hypertension: Secondary | ICD-10-CM | POA: Diagnosis present

## 2015-01-16 DIAGNOSIS — I129 Hypertensive chronic kidney disease with stage 1 through stage 4 chronic kidney disease, or unspecified chronic kidney disease: Principal | ICD-10-CM | POA: Insufficient documentation

## 2015-01-16 DIAGNOSIS — I480 Paroxysmal atrial fibrillation: Secondary | ICD-10-CM | POA: Diagnosis not present

## 2015-01-16 DIAGNOSIS — N179 Acute kidney failure, unspecified: Secondary | ICD-10-CM | POA: Insufficient documentation

## 2015-01-16 DIAGNOSIS — I5022 Chronic systolic (congestive) heart failure: Secondary | ICD-10-CM | POA: Insufficient documentation

## 2015-01-16 DIAGNOSIS — R778 Other specified abnormalities of plasma proteins: Secondary | ICD-10-CM

## 2015-01-16 DIAGNOSIS — R079 Chest pain, unspecified: Secondary | ICD-10-CM

## 2015-01-16 DIAGNOSIS — G629 Polyneuropathy, unspecified: Secondary | ICD-10-CM | POA: Diagnosis not present

## 2015-01-16 HISTORY — DX: Paroxysmal atrial fibrillation: I48.0

## 2015-01-16 LAB — CBC WITH DIFFERENTIAL/PLATELET
Basophils Absolute: 0 10*3/uL (ref 0.0–0.1)
Basophils Relative: 0 % (ref 0–1)
Eosinophils Absolute: 0.1 10*3/uL (ref 0.0–0.7)
Eosinophils Relative: 2 % (ref 0–5)
HEMATOCRIT: 38.3 % (ref 36.0–46.0)
Hemoglobin: 12.5 g/dL (ref 12.0–15.0)
Lymphocytes Relative: 30 % (ref 12–46)
Lymphs Abs: 1.5 10*3/uL (ref 0.7–4.0)
MCH: 29.9 pg (ref 26.0–34.0)
MCHC: 32.6 g/dL (ref 30.0–36.0)
MCV: 91.6 fL (ref 78.0–100.0)
MONOS PCT: 10 % (ref 3–12)
Monocytes Absolute: 0.5 10*3/uL (ref 0.1–1.0)
NEUTROS ABS: 2.9 10*3/uL (ref 1.7–7.7)
NEUTROS PCT: 58 % (ref 43–77)
Platelets: 129 10*3/uL — ABNORMAL LOW (ref 150–400)
RBC: 4.18 MIL/uL (ref 3.87–5.11)
RDW: 14.1 % (ref 11.5–15.5)
WBC: 5 10*3/uL (ref 4.0–10.5)

## 2015-01-16 LAB — URINALYSIS, ROUTINE W REFLEX MICROSCOPIC
BILIRUBIN URINE: NEGATIVE
Glucose, UA: NEGATIVE mg/dL
Ketones, ur: NEGATIVE mg/dL
Leukocytes, UA: NEGATIVE
NITRITE: NEGATIVE
PH: 7 (ref 5.0–8.0)
Specific Gravity, Urine: 1.005 (ref 1.005–1.030)
Urobilinogen, UA: 0.2 mg/dL (ref 0.0–1.0)

## 2015-01-16 LAB — COMPREHENSIVE METABOLIC PANEL
ALK PHOS: 94 U/L (ref 39–117)
ALT: 47 U/L — ABNORMAL HIGH (ref 0–35)
AST: 66 U/L — ABNORMAL HIGH (ref 0–37)
Albumin: 4.2 g/dL (ref 3.5–5.2)
Anion gap: 8 (ref 5–15)
BUN: 29 mg/dL — ABNORMAL HIGH (ref 6–23)
CO2: 29 mmol/L (ref 19–32)
Calcium: 10 mg/dL (ref 8.4–10.5)
Chloride: 100 mmol/L (ref 96–112)
Creatinine, Ser: 1.04 mg/dL (ref 0.50–1.10)
GFR calc Af Amer: 58 mL/min — ABNORMAL LOW (ref >90)
GFR calc non Af Amer: 50 mL/min — ABNORMAL LOW (ref >90)
GLUCOSE: 149 mg/dL — AB (ref 70–99)
POTASSIUM: 3.8 mmol/L (ref 3.5–5.1)
Sodium: 137 mmol/L (ref 135–145)
Total Bilirubin: 0.9 mg/dL (ref 0.3–1.2)
Total Protein: 8.2 g/dL (ref 6.0–8.3)

## 2015-01-16 LAB — TROPONIN I: Troponin I: 0.18 ng/mL — ABNORMAL HIGH (ref <0.031)

## 2015-01-16 LAB — PROTIME-INR
INR: 2.65 — ABNORMAL HIGH (ref 0.00–1.49)
Prothrombin Time: 28.3 seconds — ABNORMAL HIGH (ref 11.6–15.2)

## 2015-01-16 LAB — URINE MICROSCOPIC-ADD ON

## 2015-01-16 NOTE — ED Notes (Signed)
Pt up to br

## 2015-01-16 NOTE — ED Notes (Signed)
Pt reports BP high when taken earlier today and  Now has felt unusual with generalized weakness denies change on either side of body face is symetric

## 2015-01-16 NOTE — ED Notes (Signed)
Pt alert, NAD, calm, interactive, describes underlying vague mild jitteriness/anxiousness, also dizziness, h/o vertigo, "feels similar", also reports urinary frequency. Back up to b/r, sample requested and explained.

## 2015-01-16 NOTE — ED Notes (Addendum)
Back to room by w/c. No changes.  [facility]  Pt seen by EDP prior to RN assessment, see MD notes, pending orders.

## 2015-01-16 NOTE — ED Notes (Signed)
Pt alert, NAD, calm, interactive, steady gait. Pt to b/r by w/c.

## 2015-01-16 NOTE — ED Provider Notes (Signed)
CSN: [id number]     Arrival date & time 01/16/15  2035 History  This chart was scribed for Geoffery Lyonsouglas Gaelan Glennon, MD by Modena JanskyAlbert Thayil, ED Scribe. This patient was seen in room MH02/MH02 and the patient's care was started at 10:11 PM.   Chief Complaint  Patient presents with  . Dizziness   Patient is a 81 y.o. female presenting with dizziness. The history is provided by the patient. No language interpreter was used.  Dizziness Severity:  Moderate Onset quality:  Sudden Duration:  7 hours Timing:  Constant Progression:  Waxing and waning Chronicity:  New Relieved by:  None tried Worsened by:  Nothing Ineffective treatments:  None tried Associated symptoms: weakness (generalized)    HPI Comments: Alexis Perry is a 81 y.o. female who presents to the Emergency Department complaining of intermittent moderate dizziness that started about 7 hours ago. She reports that she was feeling dizzy and light head today so she checked her blood pressure at home. She states that her blood pressure was 206/86. Pt's blood pressure in the ED is 172/80. She states that she started having chills and urinary frequency also. She states no modifying factors. She reports being on a blood thinner. She denies any dysuria or abdominal pain.   Past Medical History  Diagnosis Date  . Hypertension   . Diabetes mellitus   . Coronary artery disease    Past Surgical History  Procedure Laterality Date  . Cardiac surgery    . Mitral valve surgery     Family History  Problem Relation Age of Onset  . Stroke Brother    History  Substance Use Topics  . Smoking status: Never Smoker   . Smokeless tobacco: Not on file  . Alcohol Use: No   OB History    No data available     Review of Systems  Gastrointestinal: Negative for abdominal pain.  Genitourinary: Positive for frequency. Negative for dysuria.  Neurological: Positive for dizziness, weakness (generalized) and light-headedness.  Hematological: Bruises/bleeds  easily.  All other systems reviewed and are negative.   Allergies  Review of patient's allergies indicates no known allergies.  Home Medications   Prior to Admission medications   Medication Sig Start Date End Date Taking? Authorizing Provider  alendronate (FOSAMAX) 70 MG tablet Take 70 mg by mouth once a week. Take with a full glass of water on an empty stomach.    Historical Provider, MD  amLODipine (NORVASC) 10 MG tablet Take 5 mg by mouth daily.     Historical Provider, MD  amoxicillin-clavulanate (AUGMENTIN) 875-125 MG per tablet Take 1 tablet by mouth 2 (two) times daily.    Historical Provider, MD  aspirin EC 81 MG tablet Take 81 mg by mouth daily.    Historical Provider, MD  atorvastatin (LIPITOR) 10 MG tablet Take 10 mg by mouth every evening.    Historical Provider, MD  brimonidine-timolol (COMBIGAN) 0.2-0.5 % ophthalmic solution Place 1 drop into both eyes every 12 (twelve) hours.    Historical Provider, MD  carvedilol (COREG) 25 MG tablet Take 12.5 mg by mouth 2 (two) times daily with a meal.    Historical Provider, MD  cholecalciferol (VITAMIN D) 1000 UNITS tablet Take 1,000 Units by mouth daily.    Historical Provider, MD  clonazePAM (KLONOPIN) 1 MG tablet Take 0.5 mg by mouth as needed for anxiety.    Historical Provider, MD  fexofenadine (ALLEGRA) 30 MG tablet Take 30 mg by mouth daily as needed (allergies).  Historical Provider, MD  furosemide (LASIX) 40 MG tablet Take 1 tablet (40 mg total) by mouth daily. TAke for two doses only 10/25/14   Margarita Grizzle, MD  gabapentin (NEURONTIN) 300 MG capsule Take 300 mg by mouth every evening.    Historical Provider, MD  glipiZIDE (GLUCOTROL XL) 2.5 MG 24 hr tablet Take 2.5 mg by mouth daily with breakfast.    Historical Provider, MD  losartan-hydrochlorothiazide (HYZAAR) 100-25 MG per tablet Take 0.5 tablets by mouth daily.    Historical Provider, MD  lubiprostone (AMITIZA) 8 MCG capsule Take 8 mcg by mouth daily as needed for  constipation.    Historical Provider, MD  metFORMIN (GLUCOPHAGE) 500 MG tablet Take 750 mg by mouth daily.     Historical Provider, MD  Multiple Vitamin (MULTIVITAMIN) tablet Take 1 tablet by mouth daily.    Historical Provider, MD  OVER THE COUNTER MEDICATION Apply 1 drop to eye 2 (two) times daily as needed (dry eyes).    Historical Provider, MD  pantoprazole (PROTONIX) 40 MG tablet Take 40 mg by mouth daily.    Historical Provider, MD  Polyethyl Glycol-Propyl Glycol (SYSTANE OP) Apply 1-2 drops to eye as needed (dry eyes).    Historical Provider, MD  senna (SENOKOT) 8.6 MG TABS tablet Take 2 tablets by mouth at bedtime as needed for mild constipation.    Historical Provider, MD  travoprost, benzalkonium, (TRAVATAN) 0.004 % ophthalmic solution Place 1 drop into both eyes at bedtime.    Historical Provider, MD  warfarin (COUMADIN) 5 MG tablet Take 5-7.5 mg by mouth daily. Takes 1 1/2 tablets (7.5mg ) daily except Monday takes 1 tablet ( )    Historical Provider, MD   BP 172/80 mmHg  Pulse 67  Temp(Src) 97.9 F (36.6 C) (Oral)  Resp 20  SpO2 100% Physical Exam  Constitutional: She is oriented to person, place, and time. She appears well-developed and well-nourished. No distress.  HENT:  Head: Normocephalic and atraumatic.  Mouth/Throat: Oropharynx is clear and moist. No oropharyngeal exudate.  Eyes: Conjunctivae and EOM are normal. Pupils are equal, round, and reactive to light.  Neck: Normal range of motion. Neck supple.  Cardiovascular: Normal rate, regular rhythm and intact distal pulses.   No murmur heard. There is a audible click at the left sternal border.  Pulmonary/Chest: Effort normal and breath sounds normal. No respiratory distress.  Abdominal: Soft. There is no tenderness. There is no rebound and no guarding.  Musculoskeletal: Normal range of motion. She exhibits no edema or tenderness.  Neurological: She is alert and oriented to person, place, and time. No cranial nerve  deficit. She exhibits normal muscle tone. Coordination normal.  Skin: Skin is warm.  Psychiatric: She has a normal mood and affect. Her behavior is normal.  Nursing note and vitals reviewed.   ED Course  Procedures (including critical care time) DIAGNOSTIC STUDIES: Oxygen Saturation is 100% on RA, normal by my interpretation.    COORDINATION OF CARE: 10:15 PM- Pt advised of plan for treatment which includes radiology and labs and pt agrees.  Labs Review Labs Reviewed - No data to display  Imaging Review No results found.   EKG Interpretation   Date/Time:  Saturday January 16 2015 22:30:42 EDT Ventricular Rate:  67 PR Interval:    QRS Duration: 138 QT Interval:  434 QTC Calculation: 458 R Axis:   -36 Text Interpretation:  Atrial fibrillation Left axis deviation Left  ventricular hypertrophy with QRS widening Cannot rule out Septal infarct ,  age  undetermined T wave abnormality, consider lateral ischemia Abnormal  ECG Confirmed by DELOS  MD, Graeden Bitner ([id number]) on 01/16/2015 10:46:06 PM      MDM   Final diagnoses:  None    Patient with history of afib, heart valve replacement, cabg on coumadin.  Presents for evaluation of headache, dizziness, weakness, and elevated blood pressure.  Workup reveals old lacunar infarcts, unchanged ekg, but elevated troponin.  I am uncertain as to the exact significance of this, whether it is an acute cardiac event or demand from elevated bp, however I feel as though she should be admitted and observed.  Her blood pressure improved while in the ED without intervention.    I have spoken with Dr. Selena Batten from the hospitalist service who agrees to admit.  I have also notified Dr. Duke Salvia who is on-call for cardiology and will consult on the patient, most likely in the morning due to patient volume.  I personally performed the services described in this documentation, which was scribed in my presence. The recorded information has been reviewed and is  accurate.       Geoffery Lyons, MD 01/17/15 [phone]

## 2015-01-16 NOTE — ED Notes (Signed)
Labs reviewed, EDP aware, pending orders.

## 2015-01-16 NOTE — ED Notes (Signed)
Back to bed and monitor w/o change, alert, NAD, calm, interactive, no dyspnea noted denies pain sob or nausea.

## 2015-01-16 NOTE — ED Notes (Signed)
Dr. Delo at BS.  

## 2015-01-17 ENCOUNTER — Encounter (HOSPITAL_COMMUNITY): Payer: Self-pay | Admitting: *Deleted

## 2015-01-17 DIAGNOSIS — F411 Generalized anxiety disorder: Secondary | ICD-10-CM | POA: Diagnosis not present

## 2015-01-17 DIAGNOSIS — R7989 Other specified abnormal findings of blood chemistry: Secondary | ICD-10-CM | POA: Diagnosis not present

## 2015-01-17 DIAGNOSIS — I10 Essential (primary) hypertension: Secondary | ICD-10-CM

## 2015-01-17 DIAGNOSIS — I160 Hypertensive urgency: Secondary | ICD-10-CM | POA: Diagnosis present

## 2015-01-17 DIAGNOSIS — R079 Chest pain, unspecified: Secondary | ICD-10-CM | POA: Diagnosis not present

## 2015-01-17 DIAGNOSIS — R778 Other specified abnormalities of plasma proteins: Secondary | ICD-10-CM

## 2015-01-17 DIAGNOSIS — Z954 Presence of other heart-valve replacement: Secondary | ICD-10-CM

## 2015-01-17 DIAGNOSIS — N183 Chronic kidney disease, stage 3 (moderate): Secondary | ICD-10-CM

## 2015-01-17 DIAGNOSIS — E118 Type 2 diabetes mellitus with unspecified complications: Secondary | ICD-10-CM

## 2015-01-17 LAB — COMPREHENSIVE METABOLIC PANEL
ALBUMIN: 3.6 g/dL (ref 3.5–5.2)
ALK PHOS: 97 U/L (ref 39–117)
ALT: 40 U/L — AB (ref 0–35)
ANION GAP: 10 (ref 5–15)
AST: 56 U/L — ABNORMAL HIGH (ref 0–37)
BILIRUBIN TOTAL: 1.3 mg/dL — AB (ref 0.3–1.2)
BUN: 20 mg/dL (ref 6–23)
CALCIUM: 9.9 mg/dL (ref 8.4–10.5)
CO2: 24 mmol/L (ref 19–32)
Chloride: 104 mmol/L (ref 96–112)
Creatinine, Ser: 1.06 mg/dL (ref 0.50–1.10)
GFR calc Af Amer: 56 mL/min — ABNORMAL LOW (ref >90)
GFR calc non Af Amer: 49 mL/min — ABNORMAL LOW (ref >90)
GLUCOSE: 172 mg/dL — AB (ref 70–99)
Potassium: 3.9 mmol/L (ref 3.5–5.1)
Sodium: 138 mmol/L (ref 135–145)
TOTAL PROTEIN: 8.3 g/dL (ref 6.0–8.3)

## 2015-01-17 LAB — CBC WITH DIFFERENTIAL/PLATELET
BASOS ABS: 0 10*3/uL (ref 0.0–0.1)
Basophils Relative: 0 % (ref 0–1)
EOS ABS: 0 10*3/uL (ref 0.0–0.7)
EOS PCT: 1 % (ref 0–5)
HCT: 39.8 % (ref 36.0–46.0)
HEMOGLOBIN: 13.2 g/dL (ref 12.0–15.0)
LYMPHS PCT: 28 % (ref 12–46)
Lymphs Abs: 1.5 10*3/uL (ref 0.7–4.0)
MCH: 30.3 pg (ref 26.0–34.0)
MCHC: 33.2 g/dL (ref 30.0–36.0)
MCV: 91.3 fL (ref 78.0–100.0)
Monocytes Absolute: 0.4 10*3/uL (ref 0.1–1.0)
Monocytes Relative: 7 % (ref 3–12)
NEUTROS ABS: 3.6 10*3/uL (ref 1.7–7.7)
Neutrophils Relative %: 64 % (ref 43–77)
PLATELETS: 150 10*3/uL (ref 150–400)
RBC: 4.36 MIL/uL (ref 3.87–5.11)
RDW: 14.3 % (ref 11.5–15.5)
WBC: 5.5 10*3/uL (ref 4.0–10.5)

## 2015-01-17 LAB — PROTIME-INR
INR: 2.37 — ABNORMAL HIGH (ref 0.00–1.49)
PROTHROMBIN TIME: 26.1 s — AB (ref 11.6–15.2)

## 2015-01-17 LAB — GLUCOSE, CAPILLARY
GLUCOSE-CAPILLARY: 176 mg/dL — AB (ref 70–99)
GLUCOSE-CAPILLARY: 176 mg/dL — AB (ref 70–99)
GLUCOSE-CAPILLARY: 293 mg/dL — AB (ref 70–99)

## 2015-01-17 LAB — CK TOTAL AND CKMB (NOT AT ARMC)
CK TOTAL: 112 U/L (ref 7–177)
CK, MB: 6.2 ng/mL — AB (ref 0.3–4.0)
Relative Index: 5.5 — ABNORMAL HIGH (ref 0.0–2.5)

## 2015-01-17 LAB — TROPONIN I
TROPONIN I: 0.23 ng/mL — AB (ref <0.031)
Troponin I: 0.21 ng/mL — ABNORMAL HIGH (ref <0.031)

## 2015-01-17 MED ORDER — ASPIRIN EC 81 MG PO TBEC
81.0000 mg | DELAYED_RELEASE_TABLET | Freq: Every day | ORAL | Status: DC
  Administered 2015-01-18 – 2015-01-19 (×2): 81 mg via ORAL
  Filled 2015-01-17 (×2): qty 1

## 2015-01-17 MED ORDER — GLIPIZIDE ER 2.5 MG PO TB24: 2.5000 mg | ORAL_TABLET | Freq: Every day | ORAL | Status: DC

## 2015-01-17 MED ORDER — ACETAMINOPHEN 325 MG PO TABS: 650.0000 mg | ORAL_TABLET | ORAL | Status: DC | PRN

## 2015-01-17 MED ORDER — CARVEDILOL 25 MG PO TABS
25.0000 mg | ORAL_TABLET | Freq: Two times a day (BID) | ORAL | Status: DC
  Administered 2015-01-17 – 2015-01-19 (×5): 25 mg via ORAL
  Filled 2015-01-17 (×8): qty 1

## 2015-01-17 MED ORDER — BRIMONIDINE TARTRATE 0.2 % OP SOLN
1.0000 [drp] | Freq: Two times a day (BID) | OPHTHALMIC | Status: DC
  Administered 2015-01-17 – 2015-01-19 (×5): 1 [drp] via OPHTHALMIC
  Filled 2015-01-17: qty 5

## 2015-01-17 MED ORDER — INSULIN ASPART 100 UNIT/ML ~~LOC~~ SOLN
0.0000 [IU] | Freq: Four times a day (QID) | SUBCUTANEOUS | Status: DC
  Administered 2015-01-17: 5 [IU] via SUBCUTANEOUS
  Administered 2015-01-17: 2 [IU] via SUBCUTANEOUS
  Administered 2015-01-18 (×2): 1 [IU] via SUBCUTANEOUS
  Administered 2015-01-18: 3 [IU] via SUBCUTANEOUS
  Administered 2015-01-19: 2 [IU] via SUBCUTANEOUS
  Administered 2015-01-19: 1 [IU] via SUBCUTANEOUS
  Administered 2015-01-19: 2 [IU] via SUBCUTANEOUS

## 2015-01-17 MED ORDER — TRAMADOL HCL 50 MG PO TABS: 50.0000 mg | ORAL_TABLET | Freq: Four times a day (QID) | ORAL | Status: DC | PRN

## 2015-01-17 MED ORDER — LOSARTAN POTASSIUM 50 MG PO TABS
100.0000 mg | ORAL_TABLET | Freq: Every day | ORAL | Status: DC
  Filled 2015-01-17: qty 2

## 2015-01-17 MED ORDER — WARFARIN SODIUM 10 MG PO TABS
10.0000 mg | ORAL_TABLET | Freq: Once | ORAL | Status: AC
  Administered 2015-01-17: 10 mg via ORAL
  Filled 2015-01-17: qty 1

## 2015-01-17 MED ORDER — TRAVOPROST (BAK FREE) 0.004 % OP SOLN
1.0000 [drp] | Freq: Every day | OPHTHALMIC | Status: DC
  Administered 2015-01-17 – 2015-01-18 (×2): 1 [drp] via OPHTHALMIC
  Filled 2015-01-17: qty 2.5

## 2015-01-17 MED ORDER — PANTOPRAZOLE SODIUM 40 MG IV SOLR
40.0000 mg | Freq: Once | INTRAVENOUS | Status: AC
  Administered 2015-01-17: 40 mg via INTRAVENOUS
  Filled 2015-01-17: qty 40

## 2015-01-17 MED ORDER — WARFARIN - PHARMACIST DOSING INPATIENT: Freq: Every day | Status: DC

## 2015-01-17 MED ORDER — LOSARTAN POTASSIUM 50 MG PO TABS
50.0000 mg | ORAL_TABLET | Freq: Every day | ORAL | Status: DC
  Administered 2015-01-17: 50 mg via ORAL
  Filled 2015-01-17: qty 1

## 2015-01-17 MED ORDER — WARFARIN SODIUM 7.5 MG PO TABS
7.5000 mg | ORAL_TABLET | Freq: Once | ORAL | Status: DC
  Filled 2015-01-17: qty 1

## 2015-01-17 MED ORDER — ATORVASTATIN CALCIUM 10 MG PO TABS
10.0000 mg | ORAL_TABLET | Freq: Every evening | ORAL | Status: DC
  Administered 2015-01-17 – 2015-01-18 (×2): 10 mg via ORAL
  Filled 2015-01-17 (×3): qty 1

## 2015-01-17 MED ORDER — PANTOPRAZOLE SODIUM 40 MG PO TBEC
40.0000 mg | DELAYED_RELEASE_TABLET | Freq: Every day | ORAL | Status: DC
  Administered 2015-01-18 – 2015-01-19 (×2): 40 mg via ORAL
  Filled 2015-01-17: qty 1

## 2015-01-17 MED ORDER — BRIMONIDINE TARTRATE-TIMOLOL 0.2-0.5 % OP SOLN: 1.0000 [drp] | Freq: Two times a day (BID) | OPHTHALMIC | Status: DC

## 2015-01-17 MED ORDER — HYDRALAZINE HCL 20 MG/ML IJ SOLN
10.0000 mg | INTRAMUSCULAR | Status: DC | PRN
  Administered 2015-01-17: 10 mg via INTRAVENOUS
  Filled 2015-01-17: qty 1

## 2015-01-17 MED ORDER — SODIUM CHLORIDE 0.9 % IV BOLUS (SEPSIS)
500.0000 mL | Freq: Once | INTRAVENOUS | Status: AC
  Administered 2015-01-17: 500 mL via INTRAVENOUS

## 2015-01-17 MED ORDER — GABAPENTIN 300 MG PO CAPS
300.0000 mg | ORAL_CAPSULE | Freq: Every evening | ORAL | Status: DC
  Administered 2015-01-17 – 2015-01-18 (×2): 300 mg via ORAL
  Filled 2015-01-17 (×3): qty 1

## 2015-01-17 MED ORDER — LOSARTAN POTASSIUM-HCTZ 100-25 MG PO TABS: 0.5000 | ORAL_TABLET | Freq: Every day | ORAL | Status: DC

## 2015-01-17 MED ORDER — CLONAZEPAM 0.5 MG PO TABS
0.5000 mg | ORAL_TABLET | Freq: Three times a day (TID) | ORAL | Status: DC | PRN
  Administered 2015-01-17 – 2015-01-19 (×2): 0.5 mg via ORAL
  Filled 2015-01-17 (×2): qty 1

## 2015-01-17 MED ORDER — AMLODIPINE BESYLATE 5 MG PO TABS
5.0000 mg | ORAL_TABLET | Freq: Every day | ORAL | Status: DC
  Administered 2015-01-17 – 2015-01-19 (×3): 5 mg via ORAL
  Filled 2015-01-17 (×3): qty 1

## 2015-01-17 MED ORDER — NITROGLYCERIN 2 % TD OINT
0.5000 [in_us] | TOPICAL_OINTMENT | Freq: Four times a day (QID) | TRANSDERMAL | Status: DC
  Administered 2015-01-17 (×2): 0.5 [in_us] via TOPICAL
  Filled 2015-01-17: qty 30

## 2015-01-17 MED ORDER — INSULIN ASPART 100 UNIT/ML ~~LOC~~ SOLN: 0.0000 [IU] | Freq: Three times a day (TID) | SUBCUTANEOUS | Status: DC

## 2015-01-17 MED ORDER — LOSARTAN POTASSIUM 50 MG PO TABS
50.0000 mg | ORAL_TABLET | Freq: Once | ORAL | Status: AC
  Administered 2015-01-17: 50 mg via ORAL
  Filled 2015-01-17: qty 1

## 2015-01-17 MED ORDER — TIMOLOL MALEATE 0.5 % OP SOLN
1.0000 [drp] | Freq: Two times a day (BID) | OPHTHALMIC | Status: DC
  Administered 2015-01-17 – 2015-01-19 (×5): 1 [drp] via OPHTHALMIC
  Filled 2015-01-17: qty 5

## 2015-01-17 MED ORDER — INSULIN ASPART 100 UNIT/ML ~~LOC~~ SOLN: 0.0000 [IU] | Freq: Every day | SUBCUTANEOUS | Status: DC

## 2015-01-17 MED ORDER — GLIPIZIDE 2.5 MG HALF TABLET
2.5000 mg | ORAL_TABLET | Freq: Every day | ORAL | Status: DC
  Administered 2015-01-18 – 2015-01-19 (×2): 2.5 mg via ORAL
  Filled 2015-01-17 (×4): qty 1

## 2015-01-17 MED ORDER — ASPIRIN EC 325 MG PO TBEC
325.0000 mg | DELAYED_RELEASE_TABLET | Freq: Every day | ORAL | Status: DC
  Administered 2015-01-17: 325 mg via ORAL
  Filled 2015-01-17: qty 1

## 2015-01-17 MED ORDER — HYDROCHLOROTHIAZIDE 12.5 MG PO CAPS
12.5000 mg | ORAL_CAPSULE | Freq: Every day | ORAL | Status: DC
  Administered 2015-01-17: 12.5 mg via ORAL
  Filled 2015-01-17 (×2): qty 1

## 2015-01-17 MED ORDER — ONDANSETRON HCL 4 MG/2ML IJ SOLN
4.0000 mg | Freq: Four times a day (QID) | INTRAMUSCULAR | Status: DC | PRN
  Administered 2015-01-17: 4 mg via INTRAVENOUS
  Filled 2015-01-17: qty 2

## 2015-01-17 NOTE — Progress Notes (Signed)
UR completed 

## 2015-01-17 NOTE — ED Notes (Signed)
Up to br

## 2015-01-17 NOTE — Progress Notes (Addendum)
Patient seen and examined Blood pressure has improved overnight  Subjective Patient denies any chest pain, troponin mildly elevated at 0.23, cardiology has seen the patient this morning    1. Hypertensive urgency  Elevated troponin. Improving Continue Norvasc, Coreg, hydrochlorothiazide, Cozaar Patient has been compliant with her medications At present we will continue her home blood pressure medications and use IV hydralazine as well as nitroglycerin ointment. Continue aspirin Coumadin for anticoagulation and has therapeutic INR. Her troponin is minimally elevated with no clear trend. It is not diagnostic of an acute coronary syndrome. Cardiology does not think further ischemia evaluation is indicated  2. neuropathy. Continue with gabapentin.  3. GERD. Continue Protonix.  4. anxiety. Patient appears significantly anxious at present I would use clonazepam 3 times a day as needed for anxiety.  5.Chronic systolic heart failure: euvolemic on exam  Diabetes mellitus patient takes glipizide which will be continued, hold metformin, continue SSI

## 2015-01-17 NOTE — Progress Notes (Signed)
ANTICOAGULATION CONSULT NOTE - Initial Consult  Pharmacy Consult for Warfarin Indication: mechanical valve  No Known Allergies  Patient Measurements: Height:  (157.5 cm) Weight: 108 lb 3.9 oz (49.1 kg) IBW/kg (Calculated) : 50.1 Heparin Dosing Weight:   Vital Signs: Temp: 98.6 F (37 C) (04/03 0300) Temp Source: Oral (04/03 0300) BP: 184/111 mmHg (04/03 0300) Pulse Rate: 77 (04/03 0300)  Labs:  Recent Labs  01/16/15 2240  HGB 12.5  HCT 38.3  PLT 129*  LABPROT 28.3*  INR 2.65*  CREATININE 1.04  TROPONINI 0.18*    Estimated Creatinine Clearance: 34 mL/min (by C-G formula based on Cr of 1.04).   Medical History: Past Medical History  Diagnosis Date  . Hypertension   . Diabetes mellitus   . Coronary artery disease     Medications:  Prescriptions prior to admission  Medication Sig Dispense Refill Last Dose  . amLODipine (NORVASC) 5 MG tablet Take 5 mg by mouth daily.     Maxwell Caul Bicarbonate (ZEGERID) 20-1100 MG CAPS capsule Take 1 capsule by mouth daily before breakfast.     . traMADol (ULTRAM) 50 MG tablet Take by mouth every 6 (six) hours as needed for moderate pain.     Marland Kitchen aspirin EC 81 MG tablet Take 81 mg by mouth daily.   09/28/2013 at Unknown time  . atorvastatin (LIPITOR) 10 MG tablet Take 10 mg by mouth every evening.   09/28/2013 at Unknown time  . brimonidine-timolol (COMBIGAN) 0.2-0.5 % ophthalmic solution Place 1 drop into both eyes every 12 (twelve) hours.   09/28/2013 at Unknown time  . carvedilol (COREG) 25 MG tablet Take 25 mg by mouth 2 (two) times daily with a meal.    09/28/2013 at 1930  . cholecalciferol (VITAMIN D) 1000 UNITS tablet Take 1,000 Units by mouth daily.   10/24/2014 at Unknown time  . clonazePAM (KLONOPIN) 1 MG tablet Take 0.5 mg by mouth as needed for anxiety.   09/28/2013 at Unknown time  . gabapentin (NEURONTIN) 300 MG capsule Take 300 mg by mouth every evening.   10/24/2014 at Unknown time  . glipiZIDE (GLUCOTROL  XL) 2.5 MG 24 hr tablet Take 2.5 mg by mouth daily with breakfast.     . losartan-hydrochlorothiazide (HYZAAR) 100-25 MG per tablet Take 0.5 tablets by mouth daily.   09/28/2013 at Unknown time  . metFORMIN (GLUCOPHAGE) 500 MG tablet Take 750 mg by mouth daily.      . Multiple Vitamin (MULTIVITAMIN) tablet Take 1 tablet by mouth daily.   09/28/2013 at Unknown time  . OVER THE COUNTER MEDICATION Apply 1 drop to eye 2 (two) times daily as needed (dry eyes).   09/26/2013  . Polyethyl Glycol-Propyl Glycol (SYSTANE OP) Apply 1-2 drops to eye as needed (dry eyes).   09/28/2013 at Unknown time  . senna (SENOKOT) 8.6 MG TABS tablet Take 2 tablets by mouth at bedtime as needed for mild constipation.   09/26/2013  . travoprost, benzalkonium, (TRAVATAN) 0.004 % ophthalmic solution Place 1 drop into both eyes at bedtime.   09/27/2013  . warfarin (COUMADIN) 5 MG tablet Take 5-7.5 mg by mouth daily. Takes 1 1/2 tablets (7.5mg ) daily except Monday takes 1 tablet ( )   10/24/2014 at Unknown time  . [DISCONTINUED] alendronate (FOSAMAX) 70 MG tablet Take 70 mg by mouth once a week. Take with a full glass of water on an empty stomach.   09/26/2013  . [DISCONTINUED] amLODipine (NORVASC) 10 MG tablet Take 5 mg by mouth daily.      . [  DISCONTINUED] amoxicillin-clavulanate (AUGMENTIN) 875-125 MG per tablet Take 1 tablet by mouth 2 (two) times daily.     . [DISCONTINUED] fexofenadine (ALLEGRA) 30 MG tablet Take 30 mg by mouth daily as needed (allergies).   09/27/2013  . [DISCONTINUED] furosemide (LASIX) 40 MG tablet Take 1 tablet (40 mg total) by mouth daily. TAke for two doses only 30 tablet 0   . [DISCONTINUED] lubiprostone (AMITIZA) 8 MCG capsule Take 8 mcg by mouth daily as needed for constipation.   09/27/2013  . [DISCONTINUED] pantoprazole (PROTONIX) 40 MG tablet Take 40 mg by mouth daily.   09/26/2013   Scheduled:  . amLODipine  5 mg Oral Daily  . aspirin EC  325 mg Oral Daily  . atorvastatin  10 mg Oral QPM  .  carvedilol  25 mg Oral BID WC  . gabapentin  300 mg Oral QPM  . insulin aspart  0-9 Units Subcutaneous Q6H  . losartan-hydrochlorothiazide  0.5 tablet Oral Daily  . nitroGLYCERIN  0.5 inch Topical 4 times per day  . pantoprazole  40 mg Oral Daily  . pantoprazole (PROTONIX) IV  40 mg Intravenous Once  . Travoprost (BAK Free)  1 drop Both Eyes QHS   Infusions:    Assessment: 81yo female with history of Afib and mechanical mitral valve [facility]iness and hypertensive. Pharmacy is consulted to dose warfarin for mechanical mitral valve. INR on admit is therapeutic at 2.65, Hgb 12.5, Plt 129, Trop 0.18, sCr 1.04.  PTA warfarin dose:   Goal of Therapy:  INR 2.5-3.5 Monitor platelets by anticoagulation protocol: Yes   Plan:  Will dose warfarin once med rec complete Daily INR/CBC Monitor s/sx of bleeding  Arlean HoppingCorey M. Newman PiesBall, PharmD Clinical Pharmacist Pager [phone] 01/17/2015,3:49 AM

## 2015-01-17 NOTE — Discharge Instructions (Addendum)
Follow-up recommendations F/U with cardiology,Dr. Heron NayKhalil.in one to 2 weeks Follow-up with PCP in 3-5 days Follow-up CBC, BMP in one week Resume lisinopril/HCTZ if renal function is stable after PCP follow-up DC metformin because of renal failure     Information on my medicine - Coumadin   (Warfarin)  This medication education was reviewed with me or my healthcare representative as part of my discharge preparation.  The pharmacist that spoke with me during my hospital stay was:  Severiano GilbertWilson, Frank Rhea, [facility] LPRPH  Why was Coumadin prescribed for you? Coumadin was prescribed for you because you have a blood clot or a medical condition that can cause an increased risk of forming blood clots. Blood clots can cause serious health problems by blocking the flow of blood to the heart, lung, or brain. Coumadin can prevent harmful blood clots from forming. As a reminder your indication for Coumadin is:   Blood Clot Prevention After Heart Valve Surgery  What test will check on my response to Coumadin? While on Coumadin (warfarin) you will need to have an INR test regularly to ensure that your dose is keeping you in the desired range. The INR (international normalized ratio) number is calculated from the result of the laboratory test called prothrombin time (PT).  If an INR APPOINTMENT HAS NOT ALREADY BEEN MADE FOR YOU please schedule an appointment to have this lab work done by your health care provider within 7 days. Your INR goal is a 2.5-3.5  What  do you need to  know  About  COUMADIN? Take Coumadin (warfarin) exactly as prescribed by your healthcare provider about the same time each day.  DO NOT stop taking without talking to the doctor who prescribed the medication.  Stopping without other blood clot prevention medication to take the place of Coumadin may increase your risk of developing a new clot or stroke.  Get refills before you run out.  What do you do if you miss a dose? If you miss a dose, take  it as soon as you remember on the same day then continue your regularly scheduled regimen the next day.  Do not take two doses of Coumadin at the same time.  Important Safety Information A possible side effect of Coumadin (Warfarin) is an increased risk of bleeding. You should call your healthcare provider right away if you experience any of the following: ? Bleeding from an injury or your nose that does not stop. ? Unusual colored urine (red or dark brown) or unusual colored stools (red or black). ? Unusual bruising for unknown reasons. ? A serious fall or if you hit your head (even if there is no bleeding).  Some foods or medicines interact with Coumadin (warfarin) and might alter your response to warfarin. To help avoid this: ? Eat a balanced diet, maintaining a consistent amount of Vitamin K. ? Notify your provider about major diet changes you plan to make. ? Avoid alcohol or limit your intake to 1 drink for women and 2 drinks for men per day. (1 drink is 5 oz. wine, 12 oz. beer, or 1.5 oz. liquor.)  Make sure that ANY health care provider who prescribes medication for you knows that you are taking Coumadin (warfarin).  Also make sure the healthcare provider who is monitoring your Coumadin knows when you have started a new medication including herbals and non-prescription products.  Coumadin (Warfarin)  Major Drug Interactions  Increased Warfarin Effect Decreased Warfarin Effect  Alcohol (large quantities) Antibiotics (esp. Septra/Bactrim, Flagyl, Cipro)  Amiodarone (Cordarone) Aspirin (ASA) Cimetidine (Tagamet) Megestrol (Megace) NSAIDs (ibuprofen, naproxen, etc.) Piroxicam (Feldene) Propafenone (Rythmol SR) Propranolol (Inderal) Isoniazid (INH) Posaconazole (Noxafil) Barbiturates (Phenobarbital) Carbamazepine (Tegretol) Chlordiazepoxide (Librium) Cholestyramine (Questran) Griseofulvin Oral Contraceptives Rifampin Sucralfate (Carafate) Vitamin K   Coumadin (Warfarin)  Major Herbal Interactions  Increased Warfarin Effect Decreased Warfarin Effect  Garlic Ginseng Ginkgo biloba Coenzyme Q10 Green tea St. Johns wort    Coumadin (Warfarin) FOOD Interactions  Eat a consistent number of servings per week of foods HIGH in Vitamin K (1 serving =  cup)  Collards (cooked, or boiled & drained) Kale (cooked, or boiled & drained) Mustard greens (cooked, or boiled & drained) Parsley *serving size only =  cup Spinach (cooked, or boiled & drained) Swiss chard (cooked, or boiled & drained) Turnip greens (cooked, or boiled & drained)  Eat a consistent number of servings per week of foods MEDIUM-HIGH in Vitamin K (1 serving = 1 cup)  Asparagus (cooked, or boiled & drained) Broccoli (cooked, boiled & drained, or raw & chopped) Brussel sprouts (cooked, or boiled & drained) *serving size only =  cup Lettuce, raw (green leaf, endive, romaine) Spinach, raw Turnip greens, raw & chopped   These websites have more information on Coumadin (warfarin):  http://www.king-russell.com/; https://www.hines.net/;   \

## 2015-01-17 NOTE — ED Notes (Signed)
BP elevated. EDP Dr. Wilkie AyeHorton aware.

## 2015-01-17 NOTE — Consult Note (Signed)
CARDIOLOGY CONSULT NOTE   Patient ID: Alexis Perry MRN: [id number], DOB/AGE: [birth year]   Admit date: 01/16/2015 Date of Consult: 01/17/2015   Primary Physician: Carlyle Lipa, MD Primary Cardiologist: Dr. Bary Castilla at [facility]  Pt. Profile  81 year old African-American female with past medical history of hypertension, DM, severe anxiety, CAD, mitral valve replacement with mechanical valve on chronic Coumadin, and paroxysmal atrial fibrillation present with dizziness, anxiety and found to have elevated trop  Problem List  Past Medical History  Diagnosis Date  . Hypertension   . Diabetes mellitus   . Coronary artery disease     Past Surgical History  Procedure Laterality Date  . Cardiac surgery    . Mitral valve surgery       Allergies  No Known Allergies  HPI   The patient is a 81 year old African-American female with past medical history of hypertension, DM, severe anxiety, CAD, mitral valve replacement with mechanical valve on chronic Coumadin, and paroxysmal atrial fibrillation. According to the patient, she underwent cardiac catheterization in 2006, she had intraoperative MI due to plaque rupture during the cardiac catheterization. She is not sure whether or not she received a stent, however states she spent 13 days in the ICU. She underwent mechanical mitral valve replacement in 2007 and has been on Coumadin since. She presented to [facility] in 2014 with chest pain and new onset of atrial fibrillation, cardiology was consulted at the time. It was felt the chest discomfort was related to atrial fibrillation and anxiety. Echocardiogram was obtained during the same admission which showed EF 35-40%, akinesis of distal anteroseptal, mid and distal anterolateral and apical myocardium, mechanical mitral prosthesis was present and functioning normally. Patient was eventually discharged to follow-up with her cardiologist at [facility]  According to the patient, she goes to  the gym once or twice a week to exercise up to a hour each time. She denies any recent chest discomfort, shortness breath, lower extremity edema, orthopnea or paroxysmal nocturnal dyspnea. She has been compliant with her medication. She woke up on Saturday 01/16/2015 feeling well. However as the day progressed, she became very anxious and have jittery and shaking feeling. She denies any obvious chest pain. She also had some dizziness and lightheadedness. She checked her blood pressure which was very high prompting the patient to seek medical attention at [facility].   On arrival to [facility], her blood pressure was 182/77, however quickly reached above 200s. He was admitted to the internal medicine service and placed on nitroglycerin ointment and IV hydralazine. Serial troponin was elevated at 0.18 and trended up to 0.23 overnight. Cardiology was consulted for elevated troponin in the setting of hypertensive urgency.  Inpatient Medications  . amLODipine  5 mg Oral Daily  . aspirin EC  325 mg Oral Daily  . atorvastatin  10 mg Oral QPM  . brimonidine  1 drop Both Eyes Q12H  . carvedilol  25 mg Oral BID WC  . gabapentin  300 mg Oral QPM  . hydrochlorothiazide  12.5 mg Oral Daily  . insulin aspart  0-9 Units Subcutaneous Q6H  . losartan  50 mg Oral Daily  . nitroGLYCERIN  0.5 inch Topical 4 times per day  . [START ON 01/18/2015] pantoprazole  40 mg Oral Daily  . timolol  1 drop Both Eyes BID  . Travoprost (BAK Free)  1 drop Both Eyes QHS  . Warfarin - Pharmacist Dosing Inpatient   Does not apply [phone]  Family History Family History  Problem Relation Age of Onset  . Stroke Brother      Social History History   Social History  . Marital Status: Widowed    Spouse Name: N/A  . Number of Children: N/A  . Years of Education: N/A   Occupational History  . Not on file.   Social History Main Topics  . Smoking status: Never Smoker   . Smokeless tobacco: Not on file  .  Alcohol Use: No  . Drug Use: No  . Sexual Activity: Yes    Birth Control/ Protection: Post-menopausal   Other Topics Concern  . Not on file   Social History Narrative     Review of Systems  General:  No chills, fever, night sweats or weight changes.  Cardiovascular:  No chest pain, dyspnea on exertion, edema, orthopnea, palpitations, paroxysmal nocturnal dyspnea. Dermatological: No rash, lesions/masses Respiratory: No cough, dyspnea Urologic: No hematuria, dysuria Abdominal:   No nausea, vomiting, diarrhea, bright red blood per rectum, melena, or hematemesis Neurologic:  No visual changes, changes in mental status. + wkns, anxious, jittery All other systems reviewed and are otherwise negative except as noted above.  Physical Exam  Blood pressure 154/71, pulse 93, temperature 98.6 F (37 C), temperature source Oral, resp. rate 20, height  (1.575 m), weight 108 lb 3.9 oz (49.1 kg), SpO2 98 %.  General: Pleasant, NAD Psych: Normal affect. Neuro: Alert and oriented X 3. Moves all extremities spontaneously. HEENT: Normal  Neck: Supple without bruits or JVD. Lungs:  Resp regular and unlabored, CTA. Heart: RRR no s3, s4, or murmurs. Abdomen: Soft, non-tender, non-distended, BS + x 4.  Extremities: No clubbing, cyanosis or edema. DP/PT/Radials 2+ and equal bilaterally.  Labs   Recent Labs  01/16/15 2240 01/17/15 0535  CKTOTAL  --  112  CKMB  --  6.2*  TROPONINI 0.18* 0.23*   Lab Results  Component Value Date   WBC 5.5 01/17/2015   HGB 13.2 01/17/2015   HCT 39.8 01/17/2015   MCV 91.3 01/17/2015   PLT 150 01/17/2015    Recent Labs Lab 01/17/15 0535  NA 138  K 3.9  CL 104  CO2 24  BUN 20  CREATININE 1.06  CALCIUM 9.9  PROT 8.3  BILITOT 1.3*  ALKPHOS 97  ALT 40*  AST 56*  GLUCOSE 172*   No results found for: CHOL, HDL, LDLCALC, TRIG No results found for: DDIMER  Radiology/Studies  Ct Head Wo Contrast  01/16/2015   CLINICAL DATA:  Acute onset of  dizziness. Headache. Hypertension. Initial encounter.  EXAM: CT HEAD WITHOUT CONTRAST  TECHNIQUE: Contiguous axial images were obtained from the base of the skull through the vertex without intravenous contrast.  COMPARISON:  MRI of the brain performed 09/19/2012  FINDINGS: There is no evidence of acute infarction, mass lesion, or intra- or extra-axial hemorrhage on CT.  There are small chronic infarcts within the occipital lobes bilaterally, and scattered small chronic infarcts within the cerebellar hemispheres. Prominence of the ventricles and sulci reflects mild cortical volume loss. Small chronic lacunar infarcts are suggested at the left thalamus.  The brainstem and fourth ventricle are within normal limits. No mass effect or midline shift is seen.  There is no evidence of fracture; visualized osseous structures are unremarkable in appearance. The visualized portions of the orbits are within normal limits. The paranasal sinuses and mastoid air cells are well-aerated. No significant soft tissue abnormalities are seen.  IMPRESSION: 1. No acute intracranial pathology seen  on CT. 2. Small chronic infarcts within the occipital lobes bilaterally, and scattered small chronic infarcts within the cerebellar hemispheres. Mild cortical volume loss noted. Small chronic lacunar infarcts suggested at the left thalamus.   Electronically Signed   By: Roanna RaiderJeffery  Chang M.D.   On: 01/16/2015 23:09    ECG  A-fib with sinus rhythm with 1st degree AV block, elevated J point and TWI in lateral leads unchanged when compare to previous EKG  ASSESSMENT AND PLAN  1. Hypertensive urgency  - unclear cause, but likely driven in part by anxiety  2. Elevated trop  - denies any CP. No SOB. Maybe related to elevated BP, will discuss with MD to see if need inpatient stress test  3. Dizziness: CT of head negative for acute process, chronic infarct seen  4. PAF  - admission EKG concerning for PAF, overnight telemetry shows sinus  rhythm with PACs, rate controlled   5. Mechanical mitral valve replacement 2007  - pending repeat EKG  6. Chronic systolic heart failure: euvolemic on exam  7. DM 8. severe anxiety 9. CAD  Signed, Alexis Perry, Hao, PA-C 01/17/2015, 8:54 AM As above, patient seen and examined. Briefly she is a 81 year old female with past medical history of [facility] valve [facility] ([facility]), paroxysmal atrial fibrillation, diabetes mellitus, hypertension for evaluation of dizziness and elevated troponin. Echocardiogram here in December 2014 showed an ejection fraction of 35-40% and a mechanical prosthetic mitral valve. She is followed in [facility]. She has mild chronic dyspnea on exertion as well as pedal edema by her report. She does not have exertional chest pain. She developed anxiety, dizziness and fatigue yesterday. She noted that her systolic blood pressure was 210 and she presented to the emergency room. She did not have chest pain. Troponin 0.18 and 0.23. Electrocardiogram shows sinus rhythm with first-degree AV block, PACs, left ventricular hypertrophy with repolarization abnormality and septal infarct. Patient's predominant complaint on admission was elevated blood pressure associated with dizziness. Her pressure has improved but remains high. Increase Cozaar to 100 mg daily. Follow blood pressure and adjust medicines as needed. Her troponin is minimally elevated with no clear trend. It is not diagnostic of an acute coronary syndrome. I do not think further ischemia evaluation is indicated. She should follow-[facility] with [facility] following [facility]. She can be discharged from a cardiac standpoint once blood pressure controlled. Olga MillersBrian Jamesyn [place]

## 2015-01-17 NOTE — ED Notes (Signed)
[facility] speaking with & updating pt.

## 2015-01-17 NOTE — H&P (Signed)
[facility] History and Physical  Patient: Alexis Perry  MRN: [id number]  DOB: July 06, 1933  DOS: the patient was seen and examined on 01/17/2015 PCP: Carlyle Lipa, MD  Chief Complaint: High blood pressure  HPI: Alexis Perry is a 81 y.o. female with Past medical history of essential hypertension, diabetes mellitus, coronary artery disease, mitral valvular replacement with medical mitral valve, on chronic anticoagulation. The patient is presenting with complaints of high blood pressure. She mentions that she has been having complaints of some dizziness and lightheadedness and she checked her blood pressure and it was elevated. Patient to continue to check her blood pressure and it continues to show a high numbers in she started having some chills seen in frequency and anxiety and therefore she came to the ER. She had some chest tightness initially but currently no chest pain he did she does not have any shortness of breath. She denies any nausea or vomiting. She denies any diarrhea or constipation does not report to have any burning urination at the time of my evaluation. She mentions she is compliant with all the medications and there is no recent change in her medications. She mentions she has chronic tingling in her left hand due to carpal tunnel syndrome.  The patient is coming from home. And at her baseline independent for most of her ADL.  Review of Systems: as mentioned in the history of present illness.  A Comprehensive review of the other systems is negative.  Past Medical History  Diagnosis Date  . Hypertension   . Diabetes mellitus   . Coronary artery disease    Past Surgical History  Procedure Laterality Date  . Cardiac surgery    . Mitral valve surgery     Social History:  reports that she has never smoked. She does not have any smokeless tobacco history on file. She reports that she does not drink alcohol or use illicit drugs.  No Known  Allergies  Family History  Problem Relation Age of Onset  . Stroke Brother     Prior to Admission medications   Medication Sig Start Date End Date Taking? Authorizing Provider  amLODipine (NORVASC) 5 MG tablet Take 5 mg by mouth daily.   Yes Historical Provider, MD  Omeprazole-Sodium Bicarbonate (ZEGERID) 20-1100 MG CAPS capsule Take 1 capsule by mouth daily before breakfast.   Yes Historical Provider, MD  traMADol (ULTRAM) 50 MG tablet Take by mouth every 6 (six) hours as needed for moderate pain.   Yes Historical Provider, MD  aspirin EC 81 MG tablet Take 81 mg by mouth daily.    Historical Provider, MD  atorvastatin (LIPITOR) 10 MG tablet Take 10 mg by mouth every evening.    Historical Provider, MD  brimonidine-timolol (COMBIGAN) 0.2-0.5 % ophthalmic solution Place 1 drop into both eyes every 12 (twelve) hours.    Historical Provider, MD  carvedilol (COREG) 25 MG tablet Take 25 mg by mouth 2 (two) times daily with a meal.     Historical Provider, MD  cholecalciferol (VITAMIN D) 1000 UNITS tablet Take 1,000 Units by mouth daily.    Historical Provider, MD  clonazePAM (KLONOPIN) 1 MG tablet Take 0.5 mg by mouth as needed for anxiety.    Historical Provider, MD  gabapentin (NEURONTIN) 300 MG capsule Take 300 mg by mouth every evening.    Historical Provider, MD  glipiZIDE (GLUCOTROL XL) 2.5 MG 24 hr tablet Take 2.5 mg by mouth daily with breakfast.    Historical Provider, MD  losartan-hydrochlorothiazide (HYZAAR) 100-25 MG per tablet Take 0.5 tablets by mouth daily.    Historical Provider, MD  metFORMIN (GLUCOPHAGE) 500 MG tablet Take 750 mg by mouth daily.     Historical Provider, MD  Multiple Vitamin (MULTIVITAMIN) tablet Take 1 tablet by mouth daily.    Historical Provider, MD  OVER THE COUNTER MEDICATION Apply 1 drop to eye 2 (two) times daily as needed (dry eyes).    Historical Provider, MD  Polyethyl Glycol-Propyl Glycol (SYSTANE OP) Apply 1-2 drops to eye as needed (dry eyes).     Historical Provider, MD  senna (SENOKOT) 8.6 MG TABS tablet Take 2 tablets by mouth at bedtime as needed for mild constipation.    Historical Provider, MD  travoprost, benzalkonium, (TRAVATAN) 0.004 % ophthalmic solution Place 1 drop into both eyes at bedtime.    Historical Provider, MD  warfarin (COUMADIN) 5 MG tablet Take 5-7.5 mg by mouth daily. Takes 1 1/2 tablets (7.5mg ) daily except Monday takes 1 tablet (5mg )    Historical Provider, MD    Physical Exam: Filed Vitals:   01/17/15 0130 01/17/15 0145 01/17/15 0300 01/17/15 0403  BP: 182/77  184/111 154/71  Pulse: 72 44 77 93  Temp:   98.6 F (37 C)   TempSrc:   Oral   Resp: 27 19 18 20   Height:   5\' 2"  (1.575 m)   Weight:   49.1 kg (108 lb 3.9 oz)   SpO2: 96% 100% 100% 98%    General: Alert, Awake and Oriented to Time, Place and Person. Appear in mild distress Eyes: PERRL ENT: Oral Mucosa clear moist. Neck: no JVD Cardiovascular: S1 and S2 Present, aortic systolic  Murmur, Peripheral Pulses Present Respiratory: Bilateral Air entry equal and Decreased, Clear to Auscultation, noCrackles, no wheezes Abdomen: Bowel Sound present, Soft and non tender Skin: no Rash Extremities: trace Pedal edema, no calf tenderness Neurologic: Grossly no focal neuro deficit.  Labs on Admission:  CBC:  Recent Labs Lab 01/16/15 2240  WBC 5.0  NEUTROABS 2.9  HGB 12.5  HCT 38.3  MCV 91.6  PLT 129*    CMP     Component Value Date/Time   NA 137 01/16/2015 2240   K 3.8 01/16/2015 2240   CL 100 01/16/2015 2240   CO2 29 01/16/2015 2240   GLUCOSE 149* 01/16/2015 2240   BUN 29* 01/16/2015 2240   CREATININE 1.04 01/16/2015 2240   CALCIUM 10.0 01/16/2015 2240   PROT 8.2 01/16/2015 2240   ALBUMIN 4.2 01/16/2015 2240   AST 66* 01/16/2015 2240   ALT 47* 01/16/2015 2240   ALKPHOS 94 01/16/2015 2240   BILITOT 0.9 01/16/2015 2240   GFRNONAA 50* 01/16/2015 2240   GFRAA 58* 01/16/2015 2240    No results for input(s): LIPASE, AMYLASE in the  last 168 hours.   Recent Labs Lab 01/16/15 2240  TROPONINI 0.18*   BNP (last 3 results)  Recent Labs  10/25/14 0920  BNP 238.6*    ProBNP (last 3 results) No results for input(s): PROBNP in the last 8760 hours.   Radiological Exams on Admission: Ct Head Wo Contrast  01/16/2015   CLINICAL DATA:  Acute onset of dizziness. Headache. Hypertension. Initial encounter.  EXAM: CT HEAD WITHOUT CONTRAST  TECHNIQUE: Contiguous axial images were obtained from the base of the skull through the vertex without intravenous contrast.  COMPARISON:  MRI of the brain performed 09/19/2012  FINDINGS: There is no evidence of acute infarction, mass lesion, or intra- or extra-axial hemorrhage on CT.  There are small chronic infarcts within the occipital lobes bilaterally, and scattered small chronic infarcts within the cerebellar hemispheres. Prominence of the ventricles and sulci reflects mild cortical volume loss. Small chronic lacunar infarcts are suggested at the left thalamus.  The brainstem and fourth ventricle are within normal limits. No mass effect or midline shift is seen.  There is no evidence of fracture; visualized osseous structures are unremarkable in appearance. The visualized portions of the orbits are within normal limits. The paranasal sinuses and mastoid air cells are well-aerated. No significant soft tissue abnormalities are seen.  IMPRESSION: 1. No acute intracranial pathology seen on CT. 2. Small chronic infarcts within the occipital lobes bilaterally, and scattered small chronic infarcts within the cerebellar hemispheres. Mild cortical volume loss noted. Small chronic lacunar infarcts suggested at the left thalamus.   Electronically Signed   By: Roanna Raider M.D.   On: 01/16/2015 23:09    EKG: Independently reviewed. normal sinus rhythm, nonspecific ST and T waves changes.  Assessment/Plan Principal Problem:   Hypertensive urgency Active Problems:   Chest pain   Diabetes mellitus    HTN (hypertension)   Mitral valve replaced   CKD (chronic kidney disease) stage 3, GFR 30-59 ml/min   Anxiety state   1. Hypertensive urgency  Elevated troponin.  the patient is presenting with complains of high blood pressure. Etiology currently is unclear. At present we will continue her home blood pressure medications and use IV hydralazine as well as nitroglycerin ointment. She appears to have elevated troponin. We will continue his monitoring serial troponin as well as up an echo program in the morning. She remains nothing by mouth after midnight. Cardiogenic to be consulted in the morning again. Currently continue with aspirin and patient is already on warfarin for anticoagulation and has therapeutic INR.  2. neuropathy. Continue with gabapentin.  3. GERD. Continue Protonix.  4. anxiety. Patient appears significantly anxious at present I would use clonazepam 3 times a day as needed for anxiety.  5.  Advance goals of care discussion:Full code    Consults: ED has consulted cardiology, Dr. Duke Salvia   DVT Prophylaxis: chronic  therapeutic anticoagulation. Nutrition: Nothing by mouth  Family Communication: family was present at bedside, opportunity was given to ask question and all questions were answered satisfactorily at the time of interview. Disposition: Admitted to observation in telemetry unit.  Author: Lynden Oxford, MD [facility] Pager: [phone] 01/17/2015, 5:18 AM    If 7PM-7AM, please contact night-coverage [email or web address] Password [email or web address]

## 2015-01-17 NOTE — ED Notes (Signed)
[facility] here for transport, no changes, VSS, family present, belongings bagged and labeled, purse shoes and clothes with pt.

## 2015-01-17 NOTE — ED Notes (Signed)
I helped patient to restroom and back, then placed her back on monitor and took new set of vitals.

## 2015-01-18 DIAGNOSIS — I480 Paroxysmal atrial fibrillation: Secondary | ICD-10-CM | POA: Diagnosis not present

## 2015-01-18 DIAGNOSIS — R7989 Other specified abnormal findings of blood chemistry: Secondary | ICD-10-CM | POA: Diagnosis not present

## 2015-01-18 DIAGNOSIS — I10 Essential (primary) hypertension: Secondary | ICD-10-CM | POA: Diagnosis not present

## 2015-01-18 DIAGNOSIS — Z954 Presence of other heart-valve replacement: Secondary | ICD-10-CM | POA: Diagnosis not present

## 2015-01-18 DIAGNOSIS — R079 Chest pain, unspecified: Secondary | ICD-10-CM | POA: Diagnosis not present

## 2015-01-18 LAB — CBC
HCT: 32.2 % — ABNORMAL LOW (ref 36.0–46.0)
HCT: 31.7 % — ABNORMAL LOW (ref 36.0–46.0)
HEMOGLOBIN: 10.4 g/dL — AB (ref 12.0–15.0)
Hemoglobin: 10.4 g/dL — ABNORMAL LOW (ref 12.0–15.0)
MCH: 29.7 pg (ref 26.0–34.0)
MCH: 29.5 pg (ref 26.0–34.0)
MCHC: 32.3 g/dL (ref 30.0–36.0)
MCHC: 32.8 g/dL (ref 30.0–36.0)
MCV: 92 fL (ref 78.0–100.0)
MCV: 90.1 fL (ref 78.0–100.0)
Platelets: 121 10*3/uL — ABNORMAL LOW (ref 150–400)
Platelets: 129 10*3/uL — ABNORMAL LOW (ref 150–400)
RBC: 3.5 MIL/uL — ABNORMAL LOW (ref 3.87–5.11)
RBC: 3.52 MIL/uL — ABNORMAL LOW (ref 3.87–5.11)
RDW: 14.9 % (ref 11.5–15.5)
RDW: 14.9 % (ref 11.5–15.5)
WBC: 4.7 10*3/uL (ref 4.0–10.5)
WBC: 4.8 10*3/uL (ref 4.0–10.5)

## 2015-01-18 LAB — BASIC METABOLIC PANEL
Anion gap: 6 (ref 5–15)
Anion gap: 8 (ref 5–15)
BUN: 37 mg/dL — ABNORMAL HIGH (ref 6–23)
BUN: 42 mg/dL — ABNORMAL HIGH (ref 6–23)
CHLORIDE: 102 mmol/L (ref 96–112)
CO2: 30 mmol/L (ref 19–32)
CO2: 27 mmol/L (ref 19–32)
CREATININE: 2 mg/dL — AB (ref 0.50–1.10)
Calcium: 9.1 mg/dL (ref 8.4–10.5)
Calcium: 9 mg/dL (ref 8.4–10.5)
Chloride: 102 mmol/L (ref 96–112)
Creatinine, Ser: 2.09 mg/dL — ABNORMAL HIGH (ref 0.50–1.10)
GFR calc Af Amer: 26 mL/min — ABNORMAL LOW (ref >90)
GFR calc Af Amer: 25 mL/min — ABNORMAL LOW (ref >90)
GFR calc non Af Amer: 23 mL/min — ABNORMAL LOW (ref >90)
GFR calc non Af Amer: 21 mL/min — ABNORMAL LOW (ref >90)
Glucose, Bld: 146 mg/dL — ABNORMAL HIGH (ref 70–99)
Glucose, Bld: 160 mg/dL — ABNORMAL HIGH (ref 70–99)
Potassium: 4 mmol/L (ref 3.5–5.1)
Potassium: 4.2 mmol/L (ref 3.5–5.1)
Sodium: 138 mmol/L (ref 135–145)
Sodium: 137 mmol/L (ref 135–145)

## 2015-01-18 LAB — HEMOGLOBIN A1C
Hgb A1c MFr Bld: 7.5 % — ABNORMAL HIGH (ref 4.8–5.6)
MEAN PLASMA GLUCOSE: 169 mg/dL

## 2015-01-18 LAB — GLUCOSE, CAPILLARY
GLUCOSE-CAPILLARY: 203 mg/dL — AB (ref 70–99)
Glucose-Capillary: 105 mg/dL — ABNORMAL HIGH (ref 70–99)
Glucose-Capillary: 121 mg/dL — ABNORMAL HIGH (ref 70–99)
Glucose-Capillary: 134 mg/dL — ABNORMAL HIGH (ref 70–99)

## 2015-01-18 LAB — PROTIME-INR
INR: 2.49 — AB (ref 0.00–1.49)
PROTHROMBIN TIME: 27.2 s — AB (ref 11.6–15.2)

## 2015-01-18 MED ORDER — WARFARIN SODIUM 10 MG PO TABS
10.0000 mg | ORAL_TABLET | Freq: Once | ORAL | Status: AC
Start: 2015-01-18 — End: 2015-01-18
  Administered 2015-01-18: 10 mg via ORAL
  Filled 2015-01-18: qty 1

## 2015-01-18 MED ORDER — SODIUM CHLORIDE 0.9 % IV SOLN
INTRAVENOUS | Status: AC
  Administered 2015-01-18: 12:00:00 via INTRAVENOUS

## 2015-01-18 NOTE — Progress Notes (Signed)
UR completed 

## 2015-01-18 NOTE — Progress Notes (Signed)
  Echocardiogram 2D Echocardiogram has been performed.  Leta JunglingCooper, Emaya Preston M 01/18/2015, 11:28 AM

## 2015-01-18 NOTE — Progress Notes (Signed)
ANTICOAGULATION CONSULT NOTE - Follow Up Consult  Pharmacy Consult for Warfarin Indication: mechanical valve  No Known Allergies  Patient Measurements: Height: 5\' 2"  (157.5 cm) Weight: 108 lb 3.9 oz (49.1 kg) IBW/kg (Calculated) : 50.1  Vital Signs: Temp: 98.6 F (37 C) (04/04 1517) Temp Source: Oral (04/04 1517) BP: 128/59 mmHg (04/04 1517) Pulse Rate: 64 (04/04 1517)  Labs:  Recent Labs  01/16/15 2240 01/17/15 0535 01/17/15 1130 01/18/15 0433 01/18/15 1402  HGB 12.5 13.2  --  10.4* 10.4*  HCT 38.3 39.8  --  32.2* 31.7*  PLT 129* 150  --  121* 129*  LABPROT 28.3* 26.1*  --  27.2*  --   INR 2.65* 2.37*  --  2.49*  --   CREATININE 1.04 1.06  --  2.00* 2.09*  CKTOTAL  --  112  --   --   --   CKMB  --  6.2*  --   --   --   TROPONINI 0.18* 0.23* 0.21*  --   --     Estimated Creatinine Clearance: 16.9 mL/min (by C-G formula based on Cr of 2.09).   Medical History: Past Medical History  Diagnosis Date  . Hypertension   . Diabetes mellitus   . Coronary artery disease   . PAF (paroxysmal atrial fibrillation)     Medications:  Prescriptions prior to admission  Medication Sig Dispense Refill Last Dose  . amLODipine (NORVASC) 5 MG tablet Take 5 mg by mouth daily.   01/16/2015 at Unknown time  . aspirin EC 81 MG tablet Take 81 mg by mouth daily.   01/16/2015 at Unknown time  . brimonidine-timolol (COMBIGAN) 0.2-0.5 % ophthalmic solution Place 1 drop into both eyes every 12 (twelve) hours.   01/16/2015 at Unknown time  . carvedilol (COREG) 25 MG tablet Take 25 mg by mouth 2 (two) times daily with a meal.    01/16/2015 at 1800  . cholecalciferol (VITAMIN D) 1000 UNITS tablet Take 1,000 Units by mouth daily.   01/16/2015 at Unknown time  . clonazePAM (KLONOPIN) 1 MG tablet Take 0.5 mg by mouth 2 (two) times daily as needed for anxiety.    01/16/2015 at Unknown time  . gabapentin (NEURONTIN) 300 MG capsule Take 300 mg by mouth every evening.   01/15/2015 at Unknown time  . glipiZIDE  (GLUCOTROL) 5 MG tablet Take 2.5 mg by mouth daily before breakfast.   01/16/2015 at Unknown time  . losartan-hydrochlorothiazide (HYZAAR) 100-25 MG per tablet Take 0.5 tablets by mouth daily.   01/16/2015 at Unknown time  . metFORMIN (GLUCOPHAGE-XR) 750 MG 24 hr tablet Take 750 mg by mouth daily with supper.   01/16/2015 at Unknown time  . Multiple Vitamin (MULTIVITAMIN) tablet Take 1 tablet by mouth daily.   01/16/2015 at Unknown time  . Omeprazole-Sodium Bicarbonate (ZEGERID) 20-1100 MG CAPS capsule Take 1 capsule by mouth daily before breakfast.   01/16/2015 at Unknown time  . traMADol (ULTRAM) 50 MG tablet Take 50 mg by mouth 3 (three) times daily as needed for moderate pain.    01/16/2015 at Unknown time  . atorvastatin (LIPITOR) 10 MG tablet Take 10 mg by mouth every evening.   01/15/2015  . Polyethyl Glycol-Propyl Glycol (SYSTANE OP) Apply 1-2 drops to eye as needed (dry eyes).   Not Taking at Unknown time  . travoprost, benzalkonium, (TRAVATAN) 0.004 % ophthalmic solution Place 1 drop into both eyes at bedtime.   01/15/2015  . warfarin (COUMADIN) 5 MG tablet Take 5-7.5 mg  by mouth daily. Takes 1 1/2 tablets (7.5mg ) daily except Monday and Thursday takes 1 tablet ( )   10/24/2014 at Unknown time  . [DISCONTINUED] alendronate (FOSAMAX) 70 MG tablet Take 70 mg by mouth once a week. Take with a full glass of water on an empty stomach.   09/26/2013  . [DISCONTINUED] amLODipine (NORVASC) 10 MG tablet Take 5 mg by mouth daily.      . [DISCONTINUED] amoxicillin-clavulanate (AUGMENTIN) 875-125 MG per tablet Take 1 tablet by mouth 2 (two) times daily.     . [DISCONTINUED] fexofenadine (ALLEGRA) 30 MG tablet Take 30 mg by mouth daily as needed (allergies).   09/27/2013  . [DISCONTINUED] furosemide (LASIX) 40 MG tablet Take 1 tablet (40 mg total) by mouth daily. TAke for two doses only 30 tablet 0   . [DISCONTINUED] lubiprostone (AMITIZA) 8 MCG capsule Take 8 mcg by mouth daily as needed for constipation.   09/27/2013   . [DISCONTINUED] pantoprazole (PROTONIX) 40 MG tablet Take 40 mg by mouth daily.   09/26/2013   Scheduled:  . amLODipine  5 mg Oral Daily  . aspirin EC  81 mg Oral Daily  . atorvastatin  10 mg Oral QPM  . brimonidine  1 drop Both Eyes Q12H  . carvedilol  25 mg Oral BID WC  . gabapentin  300 mg Oral QPM  . glipiZIDE  2.5 mg Oral QAC breakfast  . insulin aspart  0-9 Units Subcutaneous Q6H  . pantoprazole  40 mg Oral Daily  . timolol  1 drop Both Eyes BID  . Travoprost (BAK Free)  1 drop Both Eyes QHS  . Warfarin - Pharmacist Dosing Inpatient   Does not apply q1800   Infusions:  . sodium chloride 125 mL/hr at 01/18/15 1219    Assessment:  81 yo female with history of Afib and mechanical mitral valve presents from [facility] with intermittent dizziness and hypertensive urgency. Pharmacy is consulted to dose warfarin for mechanical mitral valve.   PTA warfarin dose:  Coumadin 7.5 mg daily except 5 mg Mon/Thurs  INR is slightly below goal.  Will give increased dose again tonight.  Goal of Therapy:  INR 2.5-3.5 Monitor platelets by anticoagulation protocol: Yes   Plan:  Repeat Coumadin  PO x 1 tonight Continue daily INR   [employer], Pharm.D., BCPS Clinical Pharmacist Pager [phone] 01/18/2015 4:37 PM

## 2015-01-18 NOTE — Progress Notes (Signed)
Inpatient Diabetes Program Recommendations  AACE/ADA: New Consensus Statement on Inpatient Glycemic Control (2013)  Target Ranges:  Prepandial:   less than 140 mg/dL      Peak postprandial:   less than 180 mg/dL (1-2 hours)      Critically ill patients:  140 - 180 mg/dL    Inpatient Diabetes Program Recommendations Insulin - Meal Coverage: May want to use low dose meal coverage of 2 units tidwc rather than glipizide Oral Agents: Noted pt is on metformin 750 mg/day at home. With CKD, stage 3 Metformin is not advised per ADA guidelines   Thank you Lenor CoffinAnn Ghina Bittinger, RN, MSN, CDE  Diabetes Inpatient Program Office: [phone] Pager: [phone] 8:00 am to 5:00 pm

## 2015-01-18 NOTE — Progress Notes (Signed)
[facility] PROGRESS NOTE  Alexis Perry ZOX:[id number] DOB: 01-10-1933 DOA: 01/16/2015 PCP: Carlyle Lipa, MD  Assessment/Plan: Principal Problem:   Hypertensive urgency Active Problems:   Diabetes mellitus   Mitral valve replaced   CKD (chronic kidney disease) stage 3, GFR 30-59 ml/min   Anxiety state   Elevated troponin   PAF (paroxysmal atrial fibrillation)      1. Hypertensive urgency  Elevated troponin. Improving Continue Norvasc, Coreg, holding hydrochlorothiazide, Cozaar due to AKI  Patient has been compliant with her medications BP soft, will start IVF in the setting of AKI  Continue aspirin Coumadin for anticoagulation and her INR is  therapeutic   Her troponin is minimally elevated with no clear trend. It is not diagnostic of an acute coronary syndrome. Cardiology does not think further ischemia evaluation is indicated Telemetry shows periods of atrial bigeminy and very brief PAT, but no Afib Review echo outpatient follow up with Dr. Heron Nay in [facility]  AKI  Cr has increased from 1.06>2.00, repeat labs pending Started on IVF  If better will DC home in am  2. neuropathy. Continue with gabapentin.  3. GERD. Continue Protonix.  4. anxiety. Patient appears significantly anxious at present I would use clonazepam 3 times a day as needed for anxiety.  5.Chronic systolic heart failure: euvolemic on exam  Diabetes mellitus patient takes glipizide which will be continued, hold metformin, continue SSI  Code Status: full Family Communication: family updated about patient's clinical progress Disposition Plan:  As above    Brief narrative: -year-old African-American female with past medical history of hypertension, DM, severe anxiety, CAD, mitral valve replacement with mechanical valve on chronic Coumadin, and paroxysmal atrial fibrillation. According to the patient, she underwent cardiac catheterization in 2006, she had intraoperative MI due to  plaque rupture during the cardiac catheterization. She is not sure whether or not she received a stent, however states she spent 13 days in the ICU. She underwent mechanical mitral valve replacement in 2007 and has been on Coumadin since. She presented to [facility] in 2014 with chest pain and new onset of atrial fibrillation, cardiology was consulted at the time. It was felt the chest discomfort was related to atrial fibrillation and anxiety. Echocardiogram was obtained during the same admission which showed EF 35-40%, akinesis of distal anteroseptal, mid and distal anterolateral and apical myocardium, mechanical mitral prosthesis was present and functioning normally. Patient was eventually discharged to follow-up with her cardiologist at [facility].  According to the patient, she goes to the gym once or twice a week to exercise up to a hour each time. She denies any recent chest discomfort, shortness breath, lower extremity edema, orthopnea or paroxysmal nocturnal dyspnea. She has been compliant with her medication. She woke up on Saturday 01/16/2015 feeling well. However as the day progressed, she became very anxious and have jittery and shaking feeling. She denies any obvious chest pain. She also had some dizziness and lightheadedness. She checked her blood pressure which was very high prompting the patient to seek medical attention at [facility].   On arrival to [facility], her blood pressure was 182/77, however quickly reached above 200s. He was admitted to the internal medicine service and placed on nitroglycerin ointment and IV hydralazine. Serial troponin was elevated at 0.18 and trended up to 0.23 overnight. Cardiology was consulted for elevated troponin in the setting of hypertensive urgency. Consultants:  cardiology  Procedures:  None   Antibiotics: None\   HPI/Subjective: has some  instability that she thinks relates to her vision/glasses  Objective: Filed  Vitals:   01/17/15 1449 01/17/15 1710 01/17/15 2049 01/18/15 0400  BP: 140/72 134/59 104/51 110/50  Pulse: 86 67 60 54  Temp: 98.3 F (36.8 C)  98.5 F (36.9 C) 98.3 F (36.8 C)  TempSrc: Oral  Oral Oral  Resp: 20  18 19   Height:      Weight:      SpO2: 98% 99% 99% 100%    Intake/Output Summary (Last 24 hours) at 01/18/15 1344 Last data filed at 01/18/15 1251  Gross per 24 hour  Intake    417 ml  Output    800 ml  Net   -383 ml    Exam: General appearance: alert, cooperative and no distress Neck: no carotid bruit, no JVD and thyroid not enlarged, symmetric, no tenderness/mass/nodules Lungs: clear to auscultation bilaterally Heart: regular rate and rhythm. SEM.  Abdomen: soft, non-tender; bowel sounds normal; no masses, no organomegaly Extremities: extremities normal, atraumatic, no cyanosis or edema Pulses: Left Dorsalis Pedis present 2+ Right Dorsalis Pedis present 2+ Skin: Skin color, texture, turgor normal. No rashes or lesions Neurologic: Alert and oriented X 3, normal strength and tone. Normal symmetric reflexes. Normal coordination and gait      Data Reviewed: Basic Metabolic Panel:  Recent Labs Lab 01/16/15 2240 01/17/15 0535 01/18/15 0433  NA 137 138 138  K 3.8 3.9 4.0  CL 100 104 102  CO2 29 24 30   GLUCOSE 149* 172* 146*  BUN 29* 20 37*  CREATININE 1.04 1.06 2.00*  CALCIUM 10.0 9.9 9.1    Liver Function Tests:  Recent Labs Lab 01/16/15 2240 01/17/15 0535  AST 66* 56*  ALT 47* 40*  ALKPHOS 94 97  BILITOT 0.9 1.3*  PROT 8.2 8.3  ALBUMIN 4.2 3.6   No results for input(s): LIPASE, AMYLASE in the last 168 hours. No results for input(s): AMMONIA in the last 168 hours.  CBC:  Recent Labs Lab 01/16/15 2240 01/17/15 0535 01/18/15 0433  WBC 5.0 5.5 4.7  NEUTROABS 2.9 3.6  --   HGB 12.5 13.2 10.4*  HCT 38.3 39.8 32.2*  MCV 91.6 91.3 92.0  PLT 129* 150 121*    Cardiac Enzymes:  Recent Labs Lab 01/16/15 2240 01/17/15 0535  01/17/15 1130  CKTOTAL  --  112  --   CKMB  --  6.2*  --   TROPONINI 0.18* 0.23* 0.21*   BNP (last 3 results)  Recent Labs  10/25/14 0920  BNP 238.6*    ProBNP (last 3 results) No results for input(s): PROBNP in the last 8760 hours.    CBG:  Recent Labs Lab 01/17/15 1135 01/17/15 1604 01/18/15 0016 01/18/15 0557 01/18/15 1132  GLUCAP 176* 293* 105* 134* 203*    No results found for this or any previous visit (from the past 240 hour(s)).   Studies: Ct Head Wo Contrast  01/16/2015   CLINICAL DATA:  Acute onset of dizziness. Headache. Hypertension. Initial encounter.  EXAM: CT HEAD WITHOUT CONTRAST  TECHNIQUE: Contiguous axial images were obtained from the base of the skull through the vertex without intravenous contrast.  COMPARISON:  MRI of the brain performed 09/19/2012  FINDINGS: There is no evidence of acute infarction, mass lesion, or intra- or extra-axial hemorrhage on CT.  There are small chronic infarcts within the occipital lobes bilaterally, and scattered small chronic infarcts within the cerebellar hemispheres. Prominence of the ventricles and sulci reflects mild cortical volume loss. Small chronic lacunar  infarcts are suggested at the left thalamus.  The brainstem and fourth ventricle are within normal limits. No mass effect or midline shift is seen.  There is no evidence of fracture; visualized osseous structures are unremarkable in appearance. The visualized portions of the orbits are within normal limits. The paranasal sinuses and mastoid air cells are well-aerated. No significant soft tissue abnormalities are seen.  IMPRESSION: 1. No acute intracranial pathology seen on CT. 2. Small chronic infarcts within the occipital lobes bilaterally, and scattered small chronic infarcts within the cerebellar hemispheres. Mild cortical volume loss noted. Small chronic lacunar infarcts suggested at the left thalamus.   Electronically Signed   By: Roanna Raider M.D.   On: 01/16/2015  23:09    Scheduled Meds: . amLODipine  5 mg Oral Daily  . aspirin EC  81 mg Oral Daily  . atorvastatin  10 mg Oral QPM  . brimonidine  1 drop Both Eyes Q12H  . carvedilol  25 mg Oral BID WC  . gabapentin  300 mg Oral QPM  . glipiZIDE  2.5 mg Oral QAC breakfast  . insulin aspart  0-9 Units Subcutaneous Q6H  . pantoprazole  40 mg Oral Daily  . timolol  1 drop Both Eyes BID  . Travoprost (BAK Free)  1 drop Both Eyes QHS  . Warfarin - Pharmacist Dosing Inpatient   Does not apply q1800   Continuous Infusions: . sodium chloride 125 mL/hr at 01/18/15 1219    Principal Problem:   Hypertensive urgency Active Problems:   Diabetes mellitus   Mitral valve replaced   CKD (chronic kidney disease) stage 3, GFR 30-59 ml/min   Anxiety state   Elevated troponin   PAF (paroxysmal atrial fibrillation)    Time spent: 40 minutes   [facility]  [facility] Pager [phone]. If 7PM-7AM, please contact night-coverage at [email or web address], password [facility] 01/18/2015, 1:44 PM  LOS: 1 day

## 2015-01-18 NOTE — Progress Notes (Signed)
    Subjective:  Anxiety is better. Feels "off balance upon standing and walking through eye" however it got better. Wants to go home. Used restroom 3 times yesterday that is abnormal for her. She denies CP, SOB, palpitation or headache. Last eye exam 2 months ago was normal.   Objective:  Vital Signs in the last 24 hours: Temp:  [98.3 F (36.8 C)-98.5 F (36.9 C)] 98.3 F (36.8 C) (04/04 0400) Pulse Rate:  [54-86] 54 (04/04 0400) Resp:  [18-20] 19 (04/04 0400) BP: (104-140)/(50-72) 110/50 mmHg (04/04 0400) SpO2:  [98 %-100 %] 100 % (04/04 0400)  Intake/Output from previous day:  Intake/Output Summary (Last 24 hours) at 01/18/15 1012 Last data filed at 01/18/15 [id number]  Gross per 24 hour  Intake    238 ml  Output   1000 ml  Net   -762 ml    Physical Exam: General appearance: alert, cooperative and no distress Neck: no carotid bruit, no JVD and thyroid not enlarged, symmetric, no tenderness/mass/nodules Lungs: clear to auscultation bilaterally Heart: regular rate and rhythm. SEM.  Abdomen: soft, non-tender; bowel sounds normal; no masses,  no organomegaly Extremities: extremities normal, atraumatic, no cyanosis or edema Pulses: Left Dorsalis Pedis present 2+ Right Dorsalis Pedis present 2+ Skin: Skin color, texture, turgor normal. No rashes or lesions Neurologic: Alert and oriented X 3, normal strength and tone. Normal symmetric reflexes. Normal coordination and gait   Rate: 70s  Rhythm: normal sinus rhythm. Occasional PACs. Bigeminy. PAF?   Lab Results:   Recent Labs  01/17/15 0535 01/18/15 0433  WBC 5.5 4.7  HGB 13.2 10.4*  PLT 150 121*    Recent Labs  01/17/15 0535 01/18/15 0433  NA 138 138  K 3.9 4.0  CL 104 102  CO2 24 30  GLUCOSE 172* 146*  BUN 20 37*  CREATININE 1.06 2.00*    Recent Labs  01/17/15 0535 01/17/15 1130  TROPONINI 0.23* 0.21*    Recent Labs  01/18/15 0433  INR 2.49*    Imaging: Imaging results have been reviewed    Cardiac Studies:  Assessment/Plan:   Principal Problem:   Hypertensive urgency Active Problems:   Chest pain   Diabetes mellitus   HTN (hypertension)   Mitral valve replaced   CKD (chronic kidney disease) stage 3, GFR 30-59 ml/min   Anxiety state   Elevated troponin   PLAN: Her BP is better. She is in NSR with PAF?Marland Kitchen. Her INR 2.49. Will repeat BMP and hemoglobin. Trop is trending down. Echo pending. [facility]. Hold Cozaar.    Corine ShelterLuke Kilroy PA-C [employer] [phone] 01/18/2015, 10:12 AM   I have seen and examined the patient along with Corine ShelterLuke Kilroy PA-C.  I have reviewed the chart, notes and new data.  I agree with PA/NP's note.  Key new complaints: feeling much better, has some instability that she thinks relates to her vision/glasses Key examination changes: regular rhythm. Telemetry shows periods of atrial bigeminy and very brief PAT, but no Afib. Key new findings / data: surprising drop in Hgb and increase in Creat without overt bleeding or new diuretic therapy. INR in desired range.  PLAN: Recheck labs. Review echo. From a cardiac standpoint, unless there is a surprising finding on echo , she is ready for outpatient follow up with [facility].  Thurmon FairMihai Jodeen Mclin, MD, [facility] HospitalFACC [facility] [phone] 01/18/2015, 11:33 AM

## 2015-01-19 DIAGNOSIS — I129 Hypertensive chronic kidney disease with stage 1 through stage 4 chronic kidney disease, or unspecified chronic kidney disease: Secondary | ICD-10-CM | POA: Diagnosis not present

## 2015-01-19 DIAGNOSIS — E119 Type 2 diabetes mellitus without complications: Secondary | ICD-10-CM | POA: Diagnosis not present

## 2015-01-19 DIAGNOSIS — N183 Chronic kidney disease, stage 3 (moderate): Secondary | ICD-10-CM | POA: Diagnosis not present

## 2015-01-19 DIAGNOSIS — I10 Essential (primary) hypertension: Secondary | ICD-10-CM | POA: Diagnosis not present

## 2015-01-19 DIAGNOSIS — F419 Anxiety disorder, unspecified: Secondary | ICD-10-CM | POA: Diagnosis not present

## 2015-01-19 LAB — COMPREHENSIVE METABOLIC PANEL
ALT: 26 U/L (ref 0–35)
AST: 34 U/L (ref 0–37)
Albumin: 3.1 g/dL — ABNORMAL LOW (ref 3.5–5.2)
Alkaline Phosphatase: 69 U/L (ref 39–117)
Anion gap: 5 (ref 5–15)
BILIRUBIN TOTAL: 0.9 mg/dL (ref 0.3–1.2)
BUN: 37 mg/dL — ABNORMAL HIGH (ref 6–23)
CHLORIDE: 106 mmol/L (ref 96–112)
CO2: 31 mmol/L (ref 19–32)
CREATININE: 1.55 mg/dL — AB (ref 0.50–1.10)
Calcium: 9.1 mg/dL (ref 8.4–10.5)
GFR calc non Af Amer: 31 mL/min — ABNORMAL LOW (ref >90)
GFR, EST AFRICAN AMERICAN: 36 mL/min — AB (ref >90)
GLUCOSE: 134 mg/dL — AB (ref 70–99)
Potassium: 3.8 mmol/L (ref 3.5–5.1)
Sodium: 142 mmol/L (ref 135–145)
Total Protein: 6.4 g/dL (ref 6.0–8.3)

## 2015-01-19 LAB — GLUCOSE, CAPILLARY
GLUCOSE-CAPILLARY: 147 mg/dL — AB (ref 70–99)
GLUCOSE-CAPILLARY: 167 mg/dL — AB (ref 70–99)
GLUCOSE-CAPILLARY: 169 mg/dL — AB (ref 70–99)

## 2015-01-19 LAB — CBC
HCT: 34.5 % — ABNORMAL LOW (ref 36.0–46.0)
Hemoglobin: 11 g/dL — ABNORMAL LOW (ref 12.0–15.0)
MCH: 29.4 pg (ref 26.0–34.0)
MCHC: 31.9 g/dL (ref 30.0–36.0)
MCV: 92.2 fL (ref 78.0–100.0)
Platelets: 128 10*3/uL — ABNORMAL LOW (ref 150–400)
RBC: 3.74 MIL/uL — AB (ref 3.87–5.11)
RDW: 15 % (ref 11.5–15.5)
WBC: 4.5 10*3/uL (ref 4.0–10.5)

## 2015-01-19 LAB — PROTIME-INR
INR: 2.63 — AB (ref 0.00–1.49)
PROTHROMBIN TIME: 28.3 s — AB (ref 11.6–15.2)

## 2015-01-19 MED ORDER — AMLODIPINE BESYLATE 10 MG PO TABS: 10.0000 mg | ORAL_TABLET | Freq: Every day | ORAL | Status: AC

## 2015-01-19 MED ORDER — LOSARTAN POTASSIUM-HCTZ 100-25 MG PO TABS: 0.5000 | ORAL_TABLET | Freq: Every day | ORAL | Status: AC

## 2015-01-19 MED ORDER — WARFARIN SODIUM 5 MG PO TABS
5.0000 mg | ORAL_TABLET | Freq: Once | ORAL | Status: DC
  Filled 2015-01-19: qty 1

## 2015-01-19 NOTE — Progress Notes (Signed)
Discharge instructions reviewed with patient and granddaughter. Both verbalized understanding of medication changes; all questions answered. IV discontinued/removed per protocol. Patient taken out to vehicle via volunteers.

## 2015-01-19 NOTE — Progress Notes (Signed)
    Subjective:   Her anxiety has improved. Complains of "head and eye dizziness" when she stands up and walks. She denies any CP, SOB, palpitation, headache, one side weakness or LE edema.   Objective:  Vital Signs in the last 24 hours: Temp:  [98.4 F (36.9 C)-98.6 F (37 C)] 98.4 F (36.9 C) (04/05 0521) Pulse Rate:  [63-67] 63 (04/05 0521) Resp:  [16-20] 16 (04/05 0521) BP: (124-143)/(56-60) 143/60 mmHg (04/05 0521) SpO2:  [100 %] 100 % (04/05 0521)  Intake/Output from previous day:  Intake/Output Summary (Last 24 hours) at 01/19/15 0913 Last data filed at 01/19/15 [id number]  Gross per 24 hour  Intake   1015 ml  Output   1600 ml  Net   -585 ml    Physical Exam: General appearance: alert, cooperative, appears stated age and no distress Neck: no carotid bruit and no JVD Lungs: clear to auscultation bilaterally Heart: regular rate and rhythm. SEM Abdomen: soft, non-tender; bowel sounds normal Extremities: extremities normal, atraumatic, no cyanosis or edema Pulses: Left Dorsalis Pedis present 2+ Right Dorsalis Pedis present 2+ Skin: Skin color, texture, turgor normal. No rashes or lesions Neurologic: Alert and oriented X 3,    Rate: 80s.  Rhythm: normal sinus rhythm. Prolonged PR interval?   Lab Results:  Recent Labs  01/18/15 1402 01/19/15 0530  WBC 4.8 4.5  HGB 10.4* 11.0*  PLT 129* 128*    Recent Labs  01/18/15 1402 01/19/15 0530  NA 137 142  K 4.2 3.8  CL 102 106  CO2 27 31  GLUCOSE 160* 134*  BUN 42* 37*  CREATININE 2.09* 1.55*    Recent Labs  01/17/15 0535 01/17/15 1130  TROPONINI 0.23* 0.21*    Recent Labs  01/19/15 0530  INR 2.63*     Cardiac Studies: 2D echo 01/18/15 Study Conclusions  - Left ventricle: The cavity size was normal. Wall thickness was increased in a pattern of mild LVH. Systolic function was mildly to moderately reduced. The estimated ejection fraction was in the range of 40% to 45%. There is moderate  hypokinesis of the mid-apicalanteroseptal and apical myocardium. - Mitral valve: A mechanical prosthesis was present. The findings are consistent with mild stenosis. Valve area by continuity equation (using LVOT flow): 1.02 cm^2. - Left atrium: The atrium was mildly dilated. - Right atrium: The atrium was mildly dilated.  Assessment/Plan:   Principal Problem:   Hypertensive urgency Active Problems:   CKD (chronic kidney disease) stage 3, GFR 30-59 ml/min   Anxiety state   Elevated troponin   Diabetes mellitus   Mitral valve replaced   PAF (paroxysmal atrial fibrillation)   PLAN:  Will obtain 12 lead EKG concerning long PR interval. Might adjust BB dose. Her Creatinine and hgb has improved. She [facility]. Continue Warfarin. INR 2.63.   Corine ShelterLuke Kilroy PA-C [employer]per [phone] 01/19/2015, 9:13 AM  I have seen and examined the patient along with Corine ShelterLuke Kilroy PA-C.  I have reviewed the chart, notes and new data.  I agree with PA's note.  PLAN: Hgb and creat moving back toward normal range. No new recommendations. F/U with Dr. Heron NayKhalil.  Thurmon FairMihai Cayton Cuevas, MD, [facility] HospitalFACC [facility] [phone] 01/19/2015, 11:28 AM

## 2015-01-19 NOTE — Discharge Summary (Signed)
Physician Discharge Summary  Alexis Perry MRN: [id number] DOB/AGE: 79/14/1934 79 y.o.  PCP: Omer Jack, MD   Admit date: 01/16/2015 Discharge date: 01/19/2015  Discharge Diagnoses:   Principal Problem:   Hypertensive urgency Active Problems:   Diabetes mellitus   Mitral valve replaced   CKD (chronic kidney disease) stage 3, GFR 30-59 ml/min   Anxiety state   Elevated troponin   PAF (paroxysmal atrial fibrillation)  Follow-up recommendations F/U with cardiology,Dr. Darral Dash.in one to 2 weeks Follow-up with PCP in 3-5 days Follow-up CBC, BMP in one week Resume lisinopril/HCTZ if renal function is stable after PCP follow-up DC metformin because of renal failure    Medication List    STOP taking these medications        metFORMIN 750 MG 24 hr tablet  Commonly known as:  GLUCOPHAGE-XR      TAKE these medications        amLODipine 10 MG tablet  Commonly known as:  NORVASC  Take 1 tablet (10 mg total) by mouth daily.     aspirin EC 81 MG tablet  Take 81 mg by mouth daily.     atorvastatin 10 MG tablet  Commonly known as:  LIPITOR  Take 10 mg by mouth every evening.     carvedilol 25 MG tablet  Commonly known as:  COREG  Take 25 mg by mouth 2 (two) times daily with a meal.     cholecalciferol 1000 UNITS tablet  Commonly known as:  VITAMIN D  Take 1,000 Units by mouth daily.     clonazePAM 1 MG tablet  Commonly known as:  KLONOPIN  Take 0.5 mg by mouth 2 (two) times daily as needed for anxiety.     COMBIGAN 0.2-0.5 % ophthalmic solution  Generic drug:  brimonidine-timolol  Place 1 drop into both eyes every 12 (twelve) hours.     gabapentin 300 MG capsule  Commonly known as:  NEURONTIN  Take 300 mg by mouth every evening.     glipiZIDE 5 MG tablet  Commonly known as:  GLUCOTROL  Take 2.5 mg by mouth daily before breakfast.     losartan-hydrochlorothiazide 100-25 MG per tablet  Commonly known as:  HYZAAR  Take 0.5 tablets by mouth daily.   Start taking on:  01/25/2015     multivitamin tablet  Take 1 tablet by mouth daily.     Omeprazole-Sodium Bicarbonate 20-1100 MG Caps capsule  Commonly known as:  ZEGERID  Take 1 capsule by mouth daily before breakfast.     SYSTANE OP  Apply 1-2 drops to eye as needed (dry eyes).     traMADol 50 MG tablet  Commonly known as:  ULTRAM  Take 50 mg by mouth 3 (three) times daily as needed for moderate pain.     travoprost (benzalkonium) 0.004 % ophthalmic solution  Commonly known as:  TRAVATAN  Place 1 drop into both eyes at bedtime.     warfarin 5 MG tablet  Commonly known as:  COUMADIN  Take 5-7.5 mg by mouth daily. Takes 1 1/2 tablets (7.3m) daily except Monday and Thursday takes 1 tablet (528m        Discharge Condition: stable   Disposition: 01-Home or Self Care   Consults: Cardiology    Significant Diagnostic Studies: Ct Head Wo Contrast  01/16/2015   CLINICAL DATA:  Acute onset of dizziness. Headache. Hypertension. Initial encounter.  EXAM: CT HEAD WITHOUT CONTRAST  TECHNIQUE: Contiguous axial images were obtained from the base of  the skull through the vertex without intravenous contrast.  COMPARISON:  MRI of the brain performed 09/19/2012  FINDINGS: There is no evidence of acute infarction, mass lesion, or intra- or extra-axial hemorrhage on CT.  There are small chronic infarcts within the occipital lobes bilaterally, and scattered small chronic infarcts within the cerebellar hemispheres. Prominence of the ventricles and sulci reflects mild cortical volume loss. Small chronic lacunar infarcts are suggested at the left thalamus.  The brainstem and fourth ventricle are within normal limits. No mass effect or midline shift is seen.  There is no evidence of fracture; visualized osseous structures are unremarkable in appearance. The visualized portions of the orbits are within normal limits. The paranasal sinuses and mastoid air cells are well-aerated. No significant soft  tissue abnormalities are seen.  IMPRESSION: 1. No acute intracranial pathology seen on CT. 2. Small chronic infarcts within the occipital lobes bilaterally, and scattered small chronic infarcts within the cerebellar hemispheres. Mild cortical volume loss noted. Small chronic lacunar infarcts suggested at the left thalamus.   Electronically Signed   By: Garald Balding M.D.   On: 01/16/2015 23:09    2-D echo LV EF: 40% -  45% Left ventricle: The cavity size was normal. Wall thickness was increased in a pattern of mild LVH. Systolic function was mildly to moderately reduced. The estimated ejection fraction was in the range of 40% to 45%. There is moderate hypokinesis of the mid-apicalanteroseptal and apical myocardium. - Mitral valve: A mechanical prosthesis was present. The findings are consistent with mild stenosis. Valve area by continuity equation (using LVOT flow): 1.02 cm^2. - Left atrium: The atrium was mildly dilated. - Right atrium: The atrium was mildly dilated.   Microbiology: No results found for this or any previous visit (from the past 240 hour(s)).   Labs: Results for orders placed or performed during the hospital encounter of 01/16/15 (from the past 48 hour(s))  Glucose, capillary     Status: Abnormal   Collection Time: 01/17/15  4:04 PM  Result Value Ref Range   Glucose-Capillary 293 (H) 70 - 99 mg/dL   Comment 1 Notify RN   Glucose, capillary     Status: Abnormal   Collection Time: 01/18/15 12:16 AM  Result Value Ref Range   Glucose-Capillary 105 (H) 70 - 99 mg/dL   Comment 1 Notify RN    Comment 2 Document in Chart   CBC     Status: Abnormal   Collection Time: 01/18/15  4:33 AM  Result Value Ref Range   WBC 4.7 4.0 - 10.5 K/uL   RBC 3.50 (L) 3.87 - 5.11 MIL/uL   Hemoglobin 10.4 (L) 12.0 - 15.0 g/dL    Comment: DELTA CHECK NOTED REPEATED TO VERIFY    HCT 32.2 (L) 36.0 - 46.0 %   MCV 92.0 78.0 - 100.0 fL   MCH 29.7 26.0 - 34.0 pg   MCHC 32.3  30.0 - 36.0 g/dL   RDW 14.9 11.5 - 15.5 %   Platelets 121 (L) 150 - 400 K/uL  Basic metabolic panel     Status: Abnormal   Collection Time: 01/18/15  4:33 AM  Result Value Ref Range   Sodium 138 135 - 145 mmol/L   Potassium 4.0 3.5 - 5.1 mmol/L   Chloride 102 96 - 112 mmol/L   CO2 30 19 - 32 mmol/L   Glucose, Bld 146 (H) 70 - 99 mg/dL   BUN 37 (H) 6 - 23 mg/dL    Comment: DELTA CHECK  NOTED   Creatinine, Ser 2.00 (H) 0.50 - 1.10 mg/dL    Comment: DELTA CHECK NOTED   Calcium 9.1 8.4 - 10.5 mg/dL   GFR calc non Af Amer 23 (L) >90 mL/min   GFR calc Af Amer 26 (L) >90 mL/min    Comment: (NOTE) The eGFR has been calculated using the CKD EPI equation. This calculation has not been validated in all clinical situations. eGFR's persistently <90 mL/min signify possible Chronic Kidney Disease.    Anion gap 6 5 - 15  Protime-INR     Status: Abnormal   Collection Time: 01/18/15  4:33 AM  Result Value Ref Range   Prothrombin Time 27.2 (H) 11.6 - 15.2 seconds   INR 2.49 (H) 0.00 - 1.49  Glucose, capillary     Status: Abnormal   Collection Time: 01/18/15  5:57 AM  Result Value Ref Range   Glucose-Capillary 134 (H) 70 - 99 mg/dL   Comment 1 Notify RN    Comment 2 Document in Chart   Glucose, capillary     Status: Abnormal   Collection Time: 01/18/15 11:32 AM  Result Value Ref Range   Glucose-Capillary 203 (H) 70 - 99 mg/dL  Basic metabolic panel     Status: Abnormal   Collection Time: 01/18/15  2:02 PM  Result Value Ref Range   Sodium 137 135 - 145 mmol/L   Potassium 4.2 3.5 - 5.1 mmol/L   Chloride 102 96 - 112 mmol/L   CO2 27 19 - 32 mmol/L   Glucose, Bld 160 (H) 70 - 99 mg/dL   BUN 42 (H) 6 - 23 mg/dL   Creatinine, Ser 2.09 (H) 0.50 - 1.10 mg/dL   Calcium 9.0 8.4 - 10.5 mg/dL   GFR calc non Af Amer 21 (L) >90 mL/min   GFR calc Af Amer 25 (L) >90 mL/min    Comment: (NOTE) The eGFR has been calculated using the CKD EPI equation. This calculation has not been validated in all  clinical situations. eGFR's persistently <90 mL/min signify possible Chronic Kidney Disease.    Anion gap 8 5 - 15  CBC     Status: Abnormal   Collection Time: 01/18/15  2:02 PM  Result Value Ref Range   WBC 4.8 4.0 - 10.5 K/uL   RBC 3.52 (L) 3.87 - 5.11 MIL/uL   Hemoglobin 10.4 (L) 12.0 - 15.0 g/dL   HCT 31.7 (L) 36.0 - 46.0 %   MCV 90.1 78.0 - 100.0 fL   MCH 29.5 26.0 - 34.0 pg   MCHC 32.8 30.0 - 36.0 g/dL   RDW 14.9 11.5 - 15.5 %   Platelets 129 (L) 150 - 400 K/uL  Glucose, capillary     Status: Abnormal   Collection Time: 01/18/15  4:35 PM  Result Value Ref Range   Glucose-Capillary 121 (H) 70 - 99 mg/dL  Glucose, capillary     Status: Abnormal   Collection Time: 01/19/15 12:51 AM  Result Value Ref Range   Glucose-Capillary 169 (H) 70 - 99 mg/dL   Comment 1 Notify RN    Comment 2 Document in Chart   CBC     Status: Abnormal   Collection Time: 01/19/15  5:30 AM  Result Value Ref Range   WBC 4.5 4.0 - 10.5 K/uL   RBC 3.74 (L) 3.87 - 5.11 MIL/uL   Hemoglobin 11.0 (L) 12.0 - 15.0 g/dL   HCT 34.5 (L) 36.0 - 46.0 %   MCV 92.2 78.0 - 100.0 fL  MCH 29.4 26.0 - 34.0 pg   MCHC 31.9 30.0 - 36.0 g/dL   RDW 15.0 11.5 - 15.5 %   Platelets 128 (L) 150 - 400 K/uL  Protime-INR     Status: Abnormal   Collection Time: 01/19/15  5:30 AM  Result Value Ref Range   Prothrombin Time 28.3 (H) 11.6 - 15.2 seconds   INR 2.63 (H) 0.00 - 1.49  Comprehensive metabolic panel     Status: Abnormal   Collection Time: 01/19/15  5:30 AM  Result Value Ref Range   Sodium 142 135 - 145 mmol/L   Potassium 3.8 3.5 - 5.1 mmol/L   Chloride 106 96 - 112 mmol/L   CO2 31 19 - 32 mmol/L   Glucose, Bld 134 (H) 70 - 99 mg/dL   BUN 37 (H) 6 - 23 mg/dL   Creatinine, Ser 1.55 (H) 0.50 - 1.10 mg/dL   Calcium 9.1 8.4 - 10.5 mg/dL   Total Protein 6.4 6.0 - 8.3 g/dL   Albumin 3.1 (L) 3.5 - 5.2 g/dL   AST 34 0 - 37 U/L   ALT 26 0 - 35 U/L   Alkaline Phosphatase 69 39 - 117 U/L   Total Bilirubin 0.9 0.3 - 1.2  mg/dL   GFR calc non Af Amer 31 (L) >90 mL/min   GFR calc Af Amer 36 (L) >90 mL/min    Comment: (NOTE) The eGFR has been calculated using the CKD EPI equation. This calculation has not been validated in all clinical situations. eGFR's persistently <90 mL/min signify possible Chronic Kidney Disease.    Anion gap 5 5 - 15  Glucose, capillary     Status: Abnormal   Collection Time: 01/19/15  6:29 AM  Result Value Ref Range   Glucose-Capillary 147 (H) 70 - 99 mg/dL   Comment 1 Notify RN    Comment 2 Document in Chart   Glucose, capillary     Status: Abnormal   Collection Time: 01/19/15 11:23 AM  Result Value Ref Range   Glucose-Capillary 167 (H) 70 - 99 mg/dL     HPI  60 -year-old African-American female with past medical history of hypertension, DM, severe anxiety, CAD, mitral valve replacement with mechanical valve on chronic Coumadin, and paroxysmal atrial fibrillation. According to the patient, she underwent cardiac catheterization in 2006, she had intraoperative MI due to plaque rupture during the cardiac catheterization. She is not sure whether or not she received a stent, however states she spent 13 days in the ICU. She underwent mechanical mitral valve replacement in 2007 and has been on Coumadin since. She presented to [facility] in 2014 with chest pain and new onset of atrial fibrillation, cardiology was consulted at the time. It was felt the chest discomfort was related to atrial fibrillation and anxiety. Echocardiogram was obtained during the same admission which showed EF 35-40%, akinesis of distal anteroseptal, mid and distal anterolateral and apical myocardium, mechanical mitral prosthesis was present and functioning normally. Patient was eventually discharged to follow-up with her cardiologist at [facility].  According to the patient, she goes to the gym once or twice a week to exercise up to a hour each time. She denies any recent chest discomfort, shortness breath,  lower extremity edema, orthopnea or paroxysmal nocturnal dyspnea. She has been compliant with her medication. She woke up on Saturday 01/16/2015 feeling well. However as the day progressed, she became very anxious and have jittery and shaking feeling. She denies any obvious chest pain. She also  had some dizziness and lightheadedness. She checked her blood pressure which was very high prompting the patient to seek medical attention at [facility].   On arrival to [facility], her blood pressure was 182/77, however quickly reached above 200s. He was admitted to the internal medicine service and placed on nitroglycerin ointment and IV hydralazine. Serial troponin was elevated at 0.18 and trended up to 0.23 overnight. Cardiology was consulted for elevated troponin in the setting of hypertensive urgency.  HOSPITAL COURSE:    1. Hypertensive urgency  Troponin detected 0.23 the setting of hypertensive urgency, seen by cardiology during this admission, no ischemia workup recommended by cardiology Continue Norvasc,dose increased, continue Coreg, holding hydrochlorothiazide, Cozaar due to AKI  Instructed to resume HCTZ/Cozaar after being evaluated by PCP and cardiology Continue aspirin Coumadin for anticoagulation and her INR is therapeutic  Her troponin is minimally elevated with no clear trend. It is not diagnostic of an acute coronary syndrome. Cardiology does not think further ischemia evaluation is indicated Telemetry shows periods of atrial bigeminy and very brief PAT, but no Afib Review echo [place]  [place]I  Cr has increased from 1.06>2.00.1.55, improved with IV hydration Holding hydrochlorothiazide/HCTZ Follow-up BMP next week  2. neuropathy. Continue with gabapentin.  3. GERD. Continue Protonix.  4. anxiety. Patient appears significantly anxious at present I would use clonazepam 3 times a day as needed for anxiety.    5.Chronic systolic heart failure: euvolemic on exam. EF 40-45%  Diabetes mellitus patient takes glipizide which will be continued, hold metformin because of acute kidney injury, May resume metformin if PCP desires  Code Status: full   Discharge Exam: Blood pressure 143/60, pulse 63, temperature 98.4 F (36.9 C), temperature source Oral, resp. rate 16, height _0  (1.575 m), weight 49.1 kg (108 lb 3.9 oz), SpO2 100 %.  General appearance: alert, cooperative and no distress Neck: no carotid bruit, no JVD and thyroid not enlarged, symmetric, no tenderness/mass/nodules Lungs: clear to auscultation bilaterally Heart: regular rate and rhythm. SEM.  Abdomen: soft, non-tender; bowel sounds normal; no masses, no organomegaly Extremities: extremities normal, atraumatic, no cyanosis or edema Pulses: Left Dorsalis Pedis present 2+ Right Dorsalis Pedis present 2+ Skin: Skin color, texture, turgor normal. No rashes or lesions Neurologic: Alert and oriented X 3, normal strength and tone. Normal symmetric reflexes. Normal coordination and gait        Discharge Instructions    Diet - low sodium heart healthy    Complete by:  As directed      Increase activity slowly    Complete by:  As directed            Follow-up Information    Follow up with Omer Jack, MD. Schedule an appointment as soon as possible for a visit in 1 week.   Specialty:  Internal Medicine   Contact information:   [place] [address] [phone]       Follow up with cardiology. Schedule an appointment as soon as possible for a visit in 1 week.   Contact information:     [facility]: Kathlen Brunswick MD  Website  Directions   Internist        Address: [address] [place], [place] [address]    [phone]      Signed: Reyne Dumas 01/19/2015, 1:07 PM         Initial

## 2016-07-22 ENCOUNTER — Encounter (HOSPITAL_BASED_OUTPATIENT_CLINIC_OR_DEPARTMENT_OTHER): Payer: Self-pay | Admitting: Emergency Medicine

## 2016-07-22 ENCOUNTER — Emergency Department (HOSPITAL_BASED_OUTPATIENT_CLINIC_OR_DEPARTMENT_OTHER)
Admission: EM | Admit: 2016-07-22 | Discharge: 2016-07-22 | Disposition: A | Discharge status: Home / Self Care | Payer: Medicare HMO | Attending: Emergency Medicine | Admitting: Emergency Medicine

## 2016-07-22 DIAGNOSIS — Z79899 Other long term (current) drug therapy: Secondary | ICD-10-CM | POA: Insufficient documentation

## 2016-07-22 DIAGNOSIS — K0889 Other specified disorders of teeth and supporting structures: Secondary | ICD-10-CM

## 2016-07-22 DIAGNOSIS — Z7982 Long term (current) use of aspirin: Secondary | ICD-10-CM | POA: Diagnosis not present

## 2016-07-22 DIAGNOSIS — E119 Type 2 diabetes mellitus without complications: Secondary | ICD-10-CM | POA: Insufficient documentation

## 2016-07-22 DIAGNOSIS — I129 Hypertensive chronic kidney disease with stage 1 through stage 4 chronic kidney disease, or unspecified chronic kidney disease: Secondary | ICD-10-CM | POA: Insufficient documentation

## 2016-07-22 DIAGNOSIS — R6883 Chills (without fever): Secondary | ICD-10-CM | POA: Insufficient documentation

## 2016-07-22 DIAGNOSIS — F419 Anxiety disorder, unspecified: Secondary | ICD-10-CM | POA: Insufficient documentation

## 2016-07-22 DIAGNOSIS — N183 Chronic kidney disease, stage 3 (moderate): Secondary | ICD-10-CM | POA: Diagnosis not present

## 2016-07-22 DIAGNOSIS — I2510 Atherosclerotic heart disease of native coronary artery without angina pectoris: Secondary | ICD-10-CM | POA: Diagnosis not present

## 2016-07-22 DIAGNOSIS — Z7984 Long term (current) use of oral hypoglycemic drugs: Secondary | ICD-10-CM | POA: Insufficient documentation

## 2016-07-22 LAB — CBC WITH DIFFERENTIAL/PLATELET
Basophils Absolute: 0 10*3/uL (ref 0.0–0.1)
Basophils Relative: 0 %
Eosinophils Absolute: 0.1 10*3/uL (ref 0.0–0.7)
Eosinophils Relative: 1 %
HEMATOCRIT: 30.6 % — AB (ref 36.0–46.0)
HEMOGLOBIN: 10.1 g/dL — AB (ref 12.0–15.0)
Lymphocytes Relative: 17 %
Lymphs Abs: 0.9 10*3/uL (ref 0.7–4.0)
MCH: 30.1 pg (ref 26.0–34.0)
MCHC: 33 g/dL (ref 30.0–36.0)
MCV: 91.1 fL (ref 78.0–100.0)
Monocytes Absolute: 0.6 10*3/uL (ref 0.1–1.0)
Monocytes Relative: 11 %
NEUTROS PCT: 71 %
Neutro Abs: 3.6 10*3/uL (ref 1.7–7.7)
Platelets: 158 10*3/uL (ref 150–400)
RBC: 3.36 MIL/uL — ABNORMAL LOW (ref 3.87–5.11)
RDW: 14.4 % (ref 11.5–15.5)
WBC: 5.1 10*3/uL (ref 4.0–10.5)

## 2016-07-22 LAB — COMPREHENSIVE METABOLIC PANEL
ALT: 20 U/L (ref 14–54)
ANION GAP: 7 (ref 5–15)
AST: 33 U/L (ref 15–41)
Albumin: 4 g/dL (ref 3.5–5.0)
Alkaline Phosphatase: 63 U/L (ref 38–126)
BUN: 37 mg/dL — ABNORMAL HIGH (ref 6–20)
CO2: 26 mmol/L (ref 22–32)
CREATININE: 1.7 mg/dL — AB (ref 0.44–1.00)
Calcium: 9.9 mg/dL (ref 8.9–10.3)
Chloride: 102 mmol/L (ref 101–111)
GFR calc non Af Amer: 27 mL/min — ABNORMAL LOW (ref >60)
GFR, EST AFRICAN AMERICAN: 32 mL/min — AB (ref >60)
GLUCOSE: 177 mg/dL — AB (ref 65–99)
Potassium: 4.6 mmol/L (ref 3.5–5.1)
SODIUM: 135 mmol/L (ref 135–145)
Total Bilirubin: 0.7 mg/dL (ref 0.3–1.2)
Total Protein: 7.6 g/dL (ref 6.5–8.1)

## 2016-07-22 LAB — PROTIME-INR
INR: 2.38
Prothrombin Time: 26.4 seconds — ABNORMAL HIGH (ref 11.4–15.2)

## 2016-07-22 NOTE — ED Triage Notes (Signed)
Pt in with dental pain from recent procedure, chills, and states she has been feeling anxious. Pt is alert, interactive, and in NAD.

## 2016-07-22 NOTE — ED Provider Notes (Signed)
[facility] Provider Note   CSN: [id number] Arrival date & time: 07/22/16  1626   By signing my name below, I, Teofilo Pod, attest that this documentation has been prepared under the direction and in the presence of Gwyneth Sprout, MD . Electronically Signed: Teofilo Pod, ED Scribe. 07/22/2016. 4:49 PM.   History   Chief Complaint Chief Complaint  Patient presents with  . Dental Pain  . Anxiety    The history is provided by the patient. No language interpreter was used.   HPI Comments:  Alexis Perry is a 82 y.o. female with PMHx of CAD, DM, and HTN who presents to the Emergency Department complaining of constant dental pain x4 days. Pt states that the pain is primarily on her upper teeth. Pt states that she had 7 teeth extracted 4 days ago. Pt is taking coumadin and has been bleeding constantly but mostly just oozing blood. Pt complains of associated chills, hot flashes. Pt also reports weakness and anxiety that began yesterday. Pt states that she has been unable to eat solid foods. Pt has taken hydrocodone with mild relief. Pt denies chest pain but has felt more SOB today.  Past Medical History:  Diagnosis Date  . Coronary artery disease   . Diabetes mellitus   . Hypertension   . PAF (paroxysmal atrial fibrillation) [facility])     Patient Active Problem List   Diagnosis Date Noted  . PAF (paroxysmal atrial fibrillation) (HCC) 01/18/2015  . Hypertensive urgency 01/17/2015  . Anxiety state 01/17/2015  . Elevated troponin 01/17/2015  . Diabetes mellitus (HCC) 09/29/2013  . New onset a-fib (HCC) 09/29/2013  . HTN (hypertension) 09/29/2013  . Mitral valve replaced 09/29/2013  . CKD (chronic kidney disease) 09/29/2013  . Anemia 09/29/2013  . CKD (chronic kidney disease) stage 3, GFR 30-59 ml/min 09/29/2013  . Chest pain 09/28/2013    Past Surgical History:  Procedure Laterality Date  . CARDIAC SURGERY    . MITRAL VALVE SURGERY      OB  History    No data available       Home Medications    Prior to Admission medications   Medication Sig Start Date End Date Taking? Authorizing Provider  amLODipine (NORVASC) 10 MG tablet Take 1 tablet (10 mg total) by mouth daily. 01/19/15   Richarda Overlie, MD  aspirin EC 81 MG tablet Take 81 mg by mouth daily.    Historical Provider, MD  atorvastatin (LIPITOR) 10 MG tablet Take 10 mg by mouth every evening.    Historical Provider, MD  brimonidine-timolol (COMBIGAN) 0.2-0.5 % ophthalmic solution Place 1 drop into both eyes every 12 (twelve) hours.    Historical Provider, MD  carvedilol (COREG) 25 MG tablet Take 25 mg by mouth 2 (two) times daily with a meal.     Historical Provider, MD  cholecalciferol (VITAMIN D) 1000 UNITS tablet Take 1,000 Units by mouth daily.    Historical Provider, MD  clonazePAM (KLONOPIN) 1 MG tablet Take 0.5 mg by mouth 2 (two) times daily as needed for anxiety.     Historical Provider, MD  gabapentin (NEURONTIN) 300 MG capsule Take 300 mg by mouth every evening.    Historical Provider, MD  glipiZIDE (GLUCOTROL) 5 MG tablet Take 2.5 mg by mouth daily before breakfast.    Historical Provider, MD  losartan-hydrochlorothiazide (HYZAAR) 100-25 MG per tablet Take 0.5 tablets by mouth daily. 01/25/15   Richarda Overlie, MD  Multiple Vitamin (MULTIVITAMIN) tablet Take 1 tablet by  mouth daily.    Historical Provider, MD  Omeprazole-Sodium Bicarbonate (ZEGERID) 20-1100 MG CAPS capsule Take 1 capsule by mouth daily before breakfast.    Historical Provider, MD  Polyethyl Glycol-Propyl Glycol (SYSTANE OP) Apply 1-2 drops to eye as needed (dry eyes).    Historical Provider, MD  traMADol (ULTRAM) 50 MG tablet Take 50 mg by mouth 3 (three) times daily as needed for moderate pain.     Historical Provider, MD  travoprost, benzalkonium, (TRAVATAN) 0.004 % ophthalmic solution Place 1 drop into both eyes at bedtime.    Historical Provider, MD  warfarin (COUMADIN) 5 MG tablet Take 5-7.5 mg by  mouth daily. Takes 1 1/2 tablets (7.5mg ) daily except Monday and Thursday takes 1 tablet (5mg )    Historical Provider, MD    Family History Family History  Problem Relation Age of Onset  . Stroke Brother     Social History Social History  Substance Use Topics  . Smoking status: Never Smoker  . Smokeless tobacco: Not on file  . Alcohol use No     Allergies   Review of patient's allergies indicates no known allergies.   Review of Systems Review of Systems  Constitutional: Positive for chills.  HENT: Positive for dental problem.        Positive for difficulty chewing food  Cardiovascular: Negative for chest pain.  Neurological: Positive for weakness.  Psychiatric/Behavioral: The patient is nervous/anxious.   All other systems reviewed and are negative.    Physical Exam Updated Vital Signs BP 164/65   Pulse 73   Temp 98.3 F (36.8 C)   Resp 20   Ht 5\' 2"  (1.575 m)   Wt 117 lb (53.1 kg)   SpO2 96%   BMI 21.40 kg/m   Physical Exam  Constitutional: She appears well-developed and well-nourished. No distress.  HENT:  Head: Normocephalic and atraumatic.  Multiple areas of dental extraction with clot present, minimal oozing of blood, mild swelling but no gum erythema or induration.   Eyes: Conjunctivae are normal.  Cardiovascular: Normal rate and regular rhythm.   No murmur heard. Pulmonary/Chest: Effort normal and breath sounds normal. She has no wheezes.  Abdominal: She exhibits no distension. There is no tenderness.  Neurological: She is alert.  Skin: Skin is warm and dry.  Psychiatric: She has a normal mood and affect.  Nursing note and vitals reviewed.    ED Treatments / Results  DIAGNOSTIC STUDIES:  Oxygen Saturation is 96% on RA, normal by my interpretation.    COORDINATION OF CARE:  4:49 PM Discussed treatment plan with pt at bedside and pt agreed to plan.   Labs (all labs ordered are listed, but only abnormal results are displayed) Labs  Reviewed - No data to display  EKG  EKG Interpretation None       Radiology No results found.  Procedures Procedures (including critical care time)  Medications Ordered in ED Medications - No data to display   Initial Impression / Assessment and Plan / ED Course  I have reviewed the triage vital signs and the nursing notes.  Pertinent labs & imaging results that were available during my care of the patient were reviewed by me and considered in my medical decision making (see chart for details).  Clinical Course   Patient is an elderly female presenting today with generalized weakness and not feeling well after having 7 dental extractions 4 days ago. She is on Coumadin for atrial fibrillation and last INR was checked last week which  was 3.1. She complains of an ongoing oozing from the extraction site but no frank bleeding. Decreased by mouth intake due to pain and only able to eat pured foods. She has been taking hydrocodone and amoxicillin since extraction.  She has felt more short of breath today but denies a cough, chest pain, abdominal pain or vomiting. We'll recheck hemoglobin today to ensure no significant anemia. CBC, CMP and INR pending  7:56 PM Labs at baseline and pt d/ced home.  Final Clinical Impressions(s) / ED Diagnoses   Final diagnoses:  Pain, dental    New Prescriptions New Prescriptions   No medications on file   I personally performed the services described in this documentation, which was scribed in my presence.  The recorded information has been reviewed and considered.     Gwyneth SproutWhitney Basim Bartnik, MD 07/22/16 1956

## 2016-11-30 ENCOUNTER — Emergency Department (HOSPITAL_BASED_OUTPATIENT_CLINIC_OR_DEPARTMENT_OTHER)
Admission: EM | Admit: 2016-11-30 | Discharge: 2016-12-01 | Disposition: A | Discharge status: Home / Self Care | Payer: Medicare HMO | Attending: Emergency Medicine | Admitting: Emergency Medicine

## 2016-11-30 ENCOUNTER — Encounter (HOSPITAL_BASED_OUTPATIENT_CLINIC_OR_DEPARTMENT_OTHER): Payer: Self-pay | Admitting: *Deleted

## 2016-11-30 ENCOUNTER — Emergency Department (HOSPITAL_BASED_OUTPATIENT_CLINIC_OR_DEPARTMENT_OTHER): Payer: Medicare HMO

## 2016-11-30 DIAGNOSIS — R531 Weakness: Secondary | ICD-10-CM | POA: Diagnosis present

## 2016-11-30 DIAGNOSIS — Z7982 Long term (current) use of aspirin: Secondary | ICD-10-CM | POA: Diagnosis not present

## 2016-11-30 DIAGNOSIS — R0689 Other abnormalities of breathing: Secondary | ICD-10-CM | POA: Insufficient documentation

## 2016-11-30 DIAGNOSIS — N183 Chronic kidney disease, stage 3 (moderate): Secondary | ICD-10-CM | POA: Diagnosis not present

## 2016-11-30 DIAGNOSIS — I2510 Atherosclerotic heart disease of native coronary artery without angina pectoris: Secondary | ICD-10-CM | POA: Diagnosis not present

## 2016-11-30 DIAGNOSIS — Z7984 Long term (current) use of oral hypoglycemic drugs: Secondary | ICD-10-CM | POA: Insufficient documentation

## 2016-11-30 DIAGNOSIS — R42 Dizziness and giddiness: Secondary | ICD-10-CM

## 2016-11-30 DIAGNOSIS — I129 Hypertensive chronic kidney disease with stage 1 through stage 4 chronic kidney disease, or unspecified chronic kidney disease: Secondary | ICD-10-CM | POA: Diagnosis not present

## 2016-11-30 DIAGNOSIS — E1122 Type 2 diabetes mellitus with diabetic chronic kidney disease: Secondary | ICD-10-CM | POA: Insufficient documentation

## 2016-11-30 DIAGNOSIS — Z79899 Other long term (current) drug therapy: Secondary | ICD-10-CM | POA: Diagnosis not present

## 2016-11-30 LAB — CBC WITH DIFFERENTIAL/PLATELET
Basophils Absolute: 0 10*3/uL (ref 0.0–0.1)
Basophils Relative: 0 %
EOS PCT: 2 %
Eosinophils Absolute: 0.1 10*3/uL (ref 0.0–0.7)
HCT: 27.5 % — ABNORMAL LOW (ref 36.0–46.0)
Hemoglobin: 9 g/dL — ABNORMAL LOW (ref 12.0–15.0)
LYMPHS ABS: 1.3 10*3/uL (ref 0.7–4.0)
LYMPHS PCT: 23 %
MCH: 30.1 pg (ref 26.0–34.0)
MCHC: 32.7 g/dL (ref 30.0–36.0)
MCV: 92 fL (ref 78.0–100.0)
MONO ABS: 0.6 10*3/uL (ref 0.1–1.0)
MONOS PCT: 11 %
Neutro Abs: 3.5 10*3/uL (ref 1.7–7.7)
Neutrophils Relative %: 64 %
Platelets: 146 10*3/uL — ABNORMAL LOW (ref 150–400)
RBC: 2.99 MIL/uL — ABNORMAL LOW (ref 3.87–5.11)
RDW: 14 % (ref 11.5–15.5)
WBC: 5.4 10*3/uL (ref 4.0–10.5)

## 2016-11-30 LAB — BRAIN NATRIURETIC PEPTIDE: B Natriuretic Peptide: 693.9 pg/mL — ABNORMAL HIGH (ref 0.0–100.0)

## 2016-11-30 LAB — COMPREHENSIVE METABOLIC PANEL
ALT: 21 U/L (ref 14–54)
AST: 38 U/L (ref 15–41)
Albumin: 3.8 g/dL (ref 3.5–5.0)
Alkaline Phosphatase: 62 U/L (ref 38–126)
Anion gap: 6 (ref 5–15)
BILIRUBIN TOTAL: 1.1 mg/dL (ref 0.3–1.2)
BUN: 44 mg/dL — ABNORMAL HIGH (ref 6–20)
CHLORIDE: 103 mmol/L (ref 101–111)
CO2: 26 mmol/L (ref 22–32)
Calcium: 9.7 mg/dL (ref 8.9–10.3)
Creatinine, Ser: 1.42 mg/dL — ABNORMAL HIGH (ref 0.44–1.00)
GFR calc non Af Amer: 34 mL/min — ABNORMAL LOW (ref >60)
GFR, EST AFRICAN AMERICAN: 39 mL/min — AB (ref >60)
Glucose, Bld: 148 mg/dL — ABNORMAL HIGH (ref 65–99)
POTASSIUM: 4.1 mmol/L (ref 3.5–5.1)
Sodium: 135 mmol/L (ref 135–145)
TOTAL PROTEIN: 7.5 g/dL (ref 6.5–8.1)

## 2016-11-30 LAB — URINALYSIS, MICROSCOPIC (REFLEX): Squamous Epithelial / LPF: NONE SEEN

## 2016-11-30 LAB — URINALYSIS, ROUTINE W REFLEX MICROSCOPIC
BILIRUBIN URINE: NEGATIVE
Glucose, UA: NEGATIVE mg/dL
Ketones, ur: NEGATIVE mg/dL
Leukocytes, UA: NEGATIVE
NITRITE: NEGATIVE
PROTEIN: 100 mg/dL — AB
SPECIFIC GRAVITY, URINE: 1.008 (ref 1.005–1.030)
pH: 6 (ref 5.0–8.0)

## 2016-11-30 LAB — PROTIME-INR
INR: 2.99
PROTHROMBIN TIME: 31.7 s — AB (ref 11.4–15.2)

## 2016-11-30 LAB — I-STAT TROPONIN, ED
TROPONIN I, POC: 0.05 ng/mL (ref 0.00–0.08)
TROPONIN I, POC: 0.05 ng/mL (ref 0.00–0.08)

## 2016-11-30 NOTE — ED Triage Notes (Signed)
Pt states that she has been dizzy "for moths" hx of vertigo.  Pt states that she has been increasingly generally weak for a bit over a week.  Pt also fell on 2/8 and struck her left shoulder.  She was seen for this and an xray was done (dx with a sprain). Pt has not has any focal weakness, no facial droop, no slurred speech and no confusion.  Pt lives at home with family but is able to manage her ADL independently.

## 2016-11-30 NOTE — Progress Notes (Signed)
I-stat troponin result given to Dr Schlossman. 

## 2016-11-30 NOTE — Progress Notes (Signed)
I-stat troponin result given to Dr Dalene SeltzerSchlossman.

## 2016-11-30 NOTE — ED Provider Notes (Signed)
[facility] Provider Note   CSN: [id number] Arrival date & time: 11/30/16  1853  By signing my name below, I, Alexis Perry, attest that this documentation has been prepared under the direction and in the presence of physician practitioner, Alexis MondayErin Illona Bulman, MD. Electronically Signed: Linna Darnerussell Perry, Scribe. 11/30/2016. 9:55 PM.  History   Chief Complaint Chief Complaint  Patient presents with  . Weakness    The history is provided by the patient. No language interpreter was used.     HPI Comments: Alexis Perry is a 83 y.o. female brought in by family, with PMHx including CAD, DM, HTN, and vertigo, who presents to the Emergency Department complaining of persistent generalized weakness for about one week. She notes she fell on 2/8 and landed on her left shoulder; she had an x-ray taken and was diagnosed with a shoulder sprain. She notes she struck her head during the fall but denies LOC. Pt notes she has been generally weak since the injury and her chronic dizziness has worsened over this time span. She states she has felt off-balance and unsteady while ambulating. She states her dizziness has been worse with quick position changes. Pt ambulates with a cane at baseline but has used a walker over the last week due to her balance issues. She is anticoagulated with several medications. A MRI of her head in March 2017 revealed numerous chronic cerebellar strokes bilaterally. She denies syncope, focal weakness, chest pain, SOB, nausea, vomiting, diarrhea, fever, cough, headache, or any other associated symptoms.  Past Medical History:  Diagnosis Date  . Coronary artery disease   . Diabetes mellitus   . Hypertension   . PAF (paroxysmal atrial fibrillation) [facility](HCC)     Patient Active Problem List   Diagnosis Date Noted  . PAF (paroxysmal atrial fibrillation) (HCC) 01/18/2015  . Hypertensive urgency 01/17/2015  . Anxiety state 01/17/2015  . Elevated troponin 01/17/2015  .  Diabetes mellitus (HCC) 09/29/2013  . New onset a-fib (HCC) 09/29/2013  . HTN (hypertension) 09/29/2013  . Mitral valve replaced 09/29/2013  . CKD (chronic kidney disease) 09/29/2013  . Anemia 09/29/2013  . CKD (chronic kidney disease) stage 3, GFR 30-59 ml/min 09/29/2013  . Chest pain 09/28/2013    Past Surgical History:  Procedure Laterality Date  . CARDIAC SURGERY    . MITRAL VALVE SURGERY      OB History    No data available       Home Medications    Prior to Admission medications   Medication Sig Start Date End Date Taking? Authorizing Provider  amLODipine (NORVASC) 10 MG tablet Take 1 tablet (10 mg total) by mouth daily. 01/19/15  Yes Richarda OverlieNayana Abrol, MD  aspirin EC 81 MG tablet Take 81 mg by mouth daily.   Yes Historical Provider, MD  atorvastatin (LIPITOR) 10 MG tablet Take 10 mg by mouth every evening.   Yes Historical Provider, MD  brimonidine-timolol (COMBIGAN) 0.2-0.5 % ophthalmic solution Place 1 drop into both eyes every 12 (twelve) hours.   Yes Historical Provider, MD  carvedilol (COREG) 25 MG tablet Take 25 mg by mouth 2 (two) times daily with a meal.    Yes Historical Provider, MD  cholecalciferol (VITAMIN D) 1000 UNITS tablet Take 1,000 Units by mouth daily.   Yes Historical Provider, MD  clonazePAM (KLONOPIN) 1 MG tablet Take 0.5 mg by mouth 2 (two) times daily as needed for anxiety.    Yes Historical Provider, MD  digoxin (LANOXIN) 0.125 MG tablet Take by mouth daily.  Yes Historical Provider, MD  gabapentin (NEURONTIN) 300 MG capsule Take 300 mg by mouth every evening.   Yes Historical Provider, MD  losartan-hydrochlorothiazide (HYZAAR) 100-25 MG per tablet Take 0.5 tablets by mouth daily. 01/25/15  Yes Richarda Overlie, MD  Multiple Vitamin (MULTIVITAMIN) tablet Take 1 tablet by mouth daily.   Yes Historical Provider, MD  Omeprazole-Sodium Bicarbonate (ZEGERID) 20-1100 MG CAPS capsule Take 1 capsule by mouth daily before breakfast.   Yes Historical Provider, MD    Polyethyl Glycol-Propyl Glycol (SYSTANE OP) Apply 1-2 drops to eye as needed (dry eyes).   Yes Historical Provider, MD  traMADol (ULTRAM) 50 MG tablet Take 50 mg by mouth 3 (three) times daily as needed for moderate pain.    Yes Historical Provider, MD  travoprost, benzalkonium, (TRAVATAN) 0.004 % ophthalmic solution Place 1 drop into both eyes at bedtime.   Yes Historical Provider, MD  warfarin (COUMADIN) 5 MG tablet Take 5-7.5 mg by mouth daily. Takes 1 1/2 tablets (7.5mg ) daily except Monday and Thursday takes 1 tablet (5mg )   Yes Historical Provider, MD  glipiZIDE (GLUCOTROL) 5 MG tablet Take 2.5 mg by mouth daily before breakfast.    Historical Provider, MD    Family History Family History  Problem Relation Age of Onset  . Stroke Brother     Social History Social History  Substance Use Topics  . Smoking status: Never Smoker  . Smokeless tobacco: Never Used  . Alcohol use No     Allergies   Patient has no known allergies.   Review of Systems Review of Systems  Constitutional: Negative for fever.  HENT: Negative for sore throat.   Eyes: Negative for visual disturbance.  Respiratory: Negative for cough and shortness of breath.   Cardiovascular: Negative for chest pain.  Gastrointestinal: Negative for abdominal pain, diarrhea, nausea and vomiting.  Genitourinary: Negative for difficulty urinating.  Musculoskeletal: Positive for gait problem. Negative for back pain and neck pain.  Skin: Negative for rash.  Neurological: Positive for dizziness and weakness (generalized). Negative for syncope, facial asymmetry, numbness and headaches.  Hematological: Bruises/bleeds easily.     Physical Exam Updated Vital Signs BP 183/75   Pulse 75   Temp 98.7 F (37.1 C) (Oral)   Resp 16   Wt 116 lb (52.6 kg)   SpO2 95%   BMI 21.22 kg/m   Physical Exam  Constitutional: She is oriented to person, place, and time. She appears well-developed and well-nourished. No distress.  HENT:   Head: Normocephalic and atraumatic.  Eyes: Conjunctivae and EOM are normal.  Neck: Neck supple. No tracheal deviation present.  Cardiovascular: Normal rate and intact distal pulses.   Murmur heard. Pulmonary/Chest: Effort normal. No respiratory distress.  Musculoskeletal:  Tenderness left shoulder, decreased ROM  Neurological: She is alert and oriented to person, place, and time. No cranial nerve deficit or sensory deficit. Coordination normal. GCS eye subscore is 4. GCS verbal subscore is 5. GCS motor subscore is 6.  Strength testing limited in LUE by pain in shoulder, otherwise strength 5/5 upper and lower ext  Skin: Skin is warm and dry.  Psychiatric: She has a normal mood and affect. Her behavior is normal.  Nursing note and vitals reviewed.    ED Treatments / Results  Labs (all labs ordered are listed, but only abnormal results are displayed) Labs Reviewed  CBC WITH DIFFERENTIAL/PLATELET - Abnormal; Notable for the following:       Result Value   RBC 2.99 (*)    Hemoglobin  9.0 (*)    HCT 27.5 (*)    Platelets 146 (*)    All other components within normal limits  COMPREHENSIVE METABOLIC PANEL - Abnormal; Notable for the following:    Glucose, Bld 148 (*)    BUN 44 (*)    Creatinine, Ser 1.42 (*)    GFR calc non Af Amer 34 (*)    GFR calc Af Amer 39 (*)    All other components within normal limits  URINALYSIS, ROUTINE W REFLEX MICROSCOPIC - Abnormal; Notable for the following:    Hgb urine dipstick MODERATE (*)    Protein, ur 100 (*)    All other components within normal limits  BRAIN NATRIURETIC PEPTIDE - Abnormal; Notable for the following:    B Natriuretic Peptide 693.9 (*)    All other components within normal limits  URINALYSIS, MICROSCOPIC (REFLEX) - Abnormal; Notable for the following:    Bacteria, UA RARE (*)    All other components within normal limits  PROTIME-INR - Abnormal; Notable for the following:    Prothrombin Time 31.7 (*)    All other components  within normal limits  I-STAT TROPOININ, ED  I-STAT TROPOININ, ED    EKG  EKG Interpretation  Date/Time:  Thursday November 30 2016 19:10:59 EST Ventricular Rate:  64 PR Interval:    QRS Duration: 136 QT Interval:  426 QTC Calculation: 440 R Axis:   -54 Text Interpretation:  Atrial fibrillation Nonspecific IVCD with LAD Left ventricular hypertrophy Anterior infarct, old Abnormal T, consider ischemia, lateral leads No significant change since last tracing Confirmed by [facility] MD, Shamaria Kavan ([id number]) on 11/30/2016 8:19:46 PM       Radiology Dg Chest 2 View  Result Date: 11/30/2016 CLINICAL DATA:  Generalized weakness EXAM: CHEST  2 VIEW COMPARISON:  Chest radiograph 10/25/2014 FINDINGS: Hyperinflation with biapical bullae. Cardiomediastinal silhouette is enlarged with atherosclerotic calcification in the aortic arch. No pleural effusion or pneumothorax. No focal airspace consolidation. IMPRESSION: Cardiomegaly, aortic atherosclerosis and emphysema. No focal consolidation. Electronically Signed   By: Deatra Robinson M.D.   On: 11/30/2016 20:08   Ct Head Wo Contrast  Result Date: 11/30/2016 CLINICAL DATA:  Chronic dizziness. Fall on 11/23/2016. Weakness for 1 week. EXAM: CT HEAD WITHOUT CONTRAST TECHNIQUE: Contiguous axial images were obtained from the base of the skull through the vertex without intravenous contrast. COMPARISON:  MRI brain 12/22/2015.  CT head 01/16/2015. FINDINGS: Brain: Diffuse cerebral atrophy. Mild ventricular dilatation consistent with central atrophy. Low-attenuation changes in the deep white matter consistent with small vessel ischemia. No mass effect or midline shift. Ventricles and sulci appear symmetrical. No abnormal extra-axial fluid collections. Gray-white matter junctions are distinct. Basal cisterns are not effaced. No acute intracranial hemorrhage. Vascular: Vascular calcifications are present in the carotid siphons and vertebrobasilar arteries. Skull: Calvarium  appears intact. Sinuses/Orbits: Paranasal sinuses and mastoid air cells are not opacified. Other: No significant changes since previous study. IMPRESSION: No acute intracranial abnormalities. Chronic atrophy and small vessel ischemic changes. Electronically Signed   By: Burman Nieves M.D.   On: 11/30/2016 22:38   Procedures Procedures (including critical care time)  DIAGNOSTIC STUDIES: Oxygen Saturation is 95% on RA, adequate by my interpretation.    COORDINATION OF CARE: 10:08 PM Discussed treatment plan with pt at bedside and pt agreed to plan.  Medications Ordered in ED Medications - No data to display   Initial Impression / Assessment and Plan / ED Course  I have reviewed the triage vital signs and the nursing  notes.  Pertinent labs & imaging results that were available during my care of the patient were reviewed by me and considered in my medical decision making (see chart for details).     83yo female with history of CAD, DM, htn, paroxysmal atrial fibrillation, MVR, vertigo, presents with concern for generalized weakness over the last week as well as chronic dizziness/vertigo/gait disturbance.  Regarding generalized weakness, hgb near baseline, no hx to suggest acute bleed, electrolytes near baseline, no sign of infection by history, exam, urinalysis nor CXR.  GIven fall preceding worsening symptoms in pt on coumadin, ordered head CT which was negative. No new neurologic deficits, exam WNL, doubt acute CVA.  Pt with elevated BP, has not taken nightime meds, do not feel she has hypertensive emergency by hx, exam.   Discussed all results with patient and daughter. She is wondering why she has chronic dizziness, and discussed after reviewing her imaging from March 2017, that she has signs of numerous strokes to the cerebellum and this would likely cause her vertigo and gait disturbance she describes.  Recommend continued follow up with PCP regarding this, ensure all CVA work  up/prevention has been completed.  Patient discharged in stable condition with understanding of reasons to return.   Final Clinical Impressions(s) / ED Diagnoses   Final diagnoses:  Generalized weakness  Vertigo, chronic secondary to prior CVA    New Prescriptions Discharge Medication List as of 11/30/2016 11:20 PM     I personally performed the services described in this documentation, which was scribed in my presence. The recorded information has been reviewed and is accurate.    Alexis Monday, MD 12/01/16 [phone]

## 2017-07-01 ENCOUNTER — Emergency Department (HOSPITAL_BASED_OUTPATIENT_CLINIC_OR_DEPARTMENT_OTHER)
Admission: EM | Admit: 2017-07-01 | Discharge: 2017-07-01 | Disposition: A | Discharge status: Home / Self Care | Payer: Medicare HMO | Attending: Emergency Medicine | Admitting: Emergency Medicine

## 2017-07-01 ENCOUNTER — Encounter (HOSPITAL_BASED_OUTPATIENT_CLINIC_OR_DEPARTMENT_OTHER): Payer: Self-pay | Admitting: Emergency Medicine

## 2017-07-01 ENCOUNTER — Emergency Department (HOSPITAL_BASED_OUTPATIENT_CLINIC_OR_DEPARTMENT_OTHER): Payer: Medicare HMO

## 2017-07-01 DIAGNOSIS — I129 Hypertensive chronic kidney disease with stage 1 through stage 4 chronic kidney disease, or unspecified chronic kidney disease: Secondary | ICD-10-CM | POA: Diagnosis not present

## 2017-07-01 DIAGNOSIS — I2510 Atherosclerotic heart disease of native coronary artery without angina pectoris: Secondary | ICD-10-CM | POA: Diagnosis not present

## 2017-07-01 DIAGNOSIS — E1122 Type 2 diabetes mellitus with diabetic chronic kidney disease: Secondary | ICD-10-CM | POA: Insufficient documentation

## 2017-07-01 DIAGNOSIS — Z7982 Long term (current) use of aspirin: Secondary | ICD-10-CM | POA: Insufficient documentation

## 2017-07-01 DIAGNOSIS — R600 Localized edema: Secondary | ICD-10-CM | POA: Insufficient documentation

## 2017-07-01 DIAGNOSIS — Z7984 Long term (current) use of oral hypoglycemic drugs: Secondary | ICD-10-CM | POA: Insufficient documentation

## 2017-07-01 DIAGNOSIS — Z7901 Long term (current) use of anticoagulants: Secondary | ICD-10-CM | POA: Diagnosis not present

## 2017-07-01 DIAGNOSIS — I509 Heart failure, unspecified: Secondary | ICD-10-CM | POA: Insufficient documentation

## 2017-07-01 DIAGNOSIS — R2243 Localized swelling, mass and lump, lower limb, bilateral: Secondary | ICD-10-CM | POA: Diagnosis present

## 2017-07-01 DIAGNOSIS — R609 Edema, unspecified: Secondary | ICD-10-CM

## 2017-07-01 DIAGNOSIS — N183 Chronic kidney disease, stage 3 (moderate): Secondary | ICD-10-CM | POA: Insufficient documentation

## 2017-07-01 LAB — COMPREHENSIVE METABOLIC PANEL
ALBUMIN: 3.8 g/dL (ref 3.5–5.0)
ALT: 29 U/L (ref 14–54)
AST: 52 U/L — ABNORMAL HIGH (ref 15–41)
Alkaline Phosphatase: 77 U/L (ref 38–126)
Anion gap: 5 (ref 5–15)
BUN: 38 mg/dL — ABNORMAL HIGH (ref 6–20)
CHLORIDE: 104 mmol/L (ref 101–111)
CO2: 28 mmol/L (ref 22–32)
CREATININE: 2.04 mg/dL — AB (ref 0.44–1.00)
Calcium: 9.5 mg/dL (ref 8.9–10.3)
GFR calc non Af Amer: 22 mL/min — ABNORMAL LOW (ref >60)
GFR, EST AFRICAN AMERICAN: 25 mL/min — AB (ref >60)
Glucose, Bld: 120 mg/dL — ABNORMAL HIGH (ref 65–99)
Potassium: 4.5 mmol/L (ref 3.5–5.1)
SODIUM: 137 mmol/L (ref 135–145)
Total Bilirubin: 0.9 mg/dL (ref 0.3–1.2)
Total Protein: 7.8 g/dL (ref 6.5–8.1)

## 2017-07-01 LAB — CBC WITH DIFFERENTIAL/PLATELET
BASOS ABS: 0 10*3/uL (ref 0.0–0.1)
BASOS PCT: 1 %
EOS ABS: 0.1 10*3/uL (ref 0.0–0.7)
EOS PCT: 3 %
HCT: 29.6 % — ABNORMAL LOW (ref 36.0–46.0)
Hemoglobin: 9.7 g/dL — ABNORMAL LOW (ref 12.0–15.0)
LYMPHS ABS: 1.2 10*3/uL (ref 0.7–4.0)
Lymphocytes Relative: 27 %
MCH: 30 pg (ref 26.0–34.0)
MCHC: 32.8 g/dL (ref 30.0–36.0)
MCV: 91.6 fL (ref 78.0–100.0)
Monocytes Absolute: 0.5 10*3/uL (ref 0.1–1.0)
Monocytes Relative: 11 %
Neutro Abs: 2.6 10*3/uL (ref 1.7–7.7)
Neutrophils Relative %: 58 %
PLATELETS: 168 10*3/uL (ref 150–400)
RBC: 3.23 MIL/uL — AB (ref 3.87–5.11)
RDW: 14.7 % (ref 11.5–15.5)
WBC: 4.4 10*3/uL (ref 4.0–10.5)

## 2017-07-01 LAB — URINALYSIS, MICROSCOPIC (REFLEX)

## 2017-07-01 LAB — URINALYSIS, ROUTINE W REFLEX MICROSCOPIC
Bilirubin Urine: NEGATIVE
Glucose, UA: NEGATIVE mg/dL
KETONES UR: NEGATIVE mg/dL
LEUKOCYTES UA: NEGATIVE
NITRITE: NEGATIVE
PH: 5.5 (ref 5.0–8.0)
PROTEIN: 100 mg/dL — AB
Specific Gravity, Urine: 1.01 (ref 1.005–1.030)

## 2017-07-01 LAB — PROTIME-INR
INR: 3.24
Prothrombin Time: 32.8 seconds — ABNORMAL HIGH (ref 11.4–15.2)

## 2017-07-01 LAB — BRAIN NATRIURETIC PEPTIDE: B NATRIURETIC PEPTIDE 5: 729.7 pg/mL — AB (ref 0.0–100.0)

## 2017-07-01 MED ORDER — FUROSEMIDE 20 MG PO TABS
20.0000 mg | ORAL_TABLET | Freq: Once | ORAL | Status: AC
Start: 2017-07-01 — End: 2017-07-01
  Administered 2017-07-01: 20 mg via ORAL
  Filled 2017-07-01: qty 1

## 2017-07-01 MED ORDER — FUROSEMIDE 20 MG PO TABS: 20.0000 mg | ORAL_TABLET | Freq: Every day | ORAL | 0 refills | Status: AC

## 2017-07-01 NOTE — ED Provider Notes (Signed)
[facility]   CSN: [id number] Arrival date & time: 07/01/17  1715     History   Chief Complaint Chief Complaint  Patient presents with  . Leg Swelling    HPI Alexis Perry is a 83 y.o. female.  HPI  Bilateral leg swelling, saw Dr. Maurice Small week and he reported would be concerned if they didn't go down at night, initially they went down when she went to bed, and this morning they were still swollen this morning after being up all night. Amount of swelling has been worsening as well, over the last few months.    No shortness of breath.  Monday night missed medications, felt Monday night didn't rest well, wasn't breathing normal.  Tuesday AM was feeling weak al over. Now is feeling improved with exception of leg swelling. Walks with cane with arthritis and walker.  Reports chronic dizziness, unsteadiness since diagnosis of stroke in the back of the brain. Reports this is unchanged. Takes clonazepam for nerves.  No chest pain.   Past Medical History:  Diagnosis Date  . Coronary artery disease   . Diabetes mellitus   . Hypertension   . PAF (paroxysmal atrial fibrillation) [facility])     Patient Active Problem List   Diagnosis Date Noted  . PAF (paroxysmal atrial fibrillation) (HCC) 01/18/2015  . Hypertensive urgency 01/17/2015  . Anxiety state 01/17/2015  . Elevated troponin 01/17/2015  . Diabetes mellitus (HCC) 09/29/2013  . New onset a-fib (HCC) 09/29/2013  . HTN (hypertension) 09/29/2013  . Mitral valve replaced 09/29/2013  . CKD (chronic kidney disease) 09/29/2013  . Anemia 09/29/2013  . CKD (chronic kidney disease) stage 3, GFR 30-59 ml/min 09/29/2013  . Chest pain 09/28/2013    Past Surgical History:  Procedure Laterality Date  . CARDIAC SURGERY    . MITRAL VALVE SURGERY      OB History    No data available       Home Medications    Prior to Admission medications   Medication Sig Start Date End Date Taking? Authorizing Provider    amLODipine (NORVASC) 10 MG tablet Take 1 tablet (10 mg total) by mouth daily. 01/19/15   Richarda Overlie, MD  aspirin EC 81 MG tablet Take 81 mg by mouth daily.    [provider]  atorvastatin (LIPITOR) 10 MG tablet Take 10 mg by mouth every evening.    [provider]  brimonidine-timolol (COMBIGAN) 0.2-0.5 % ophthalmic solution Place 1 drop into both eyes every 12 (twelve) hours.    [provider]  carvedilol (COREG) 25 MG tablet Take 25 mg by mouth 2 (two) times daily with a meal.     [provider]  cholecalciferol (VITAMIN D) 1000 UNITS tablet Take 1,000 Units by mouth daily.    [provider]  clonazePAM (KLONOPIN) 1 MG tablet Take 0.5 mg by mouth 2 (two) times daily as needed for anxiety.     [provider]  digoxin (LANOXIN) 0.125 MG tablet Take by mouth daily.    [provider]  furosemide (LASIX) 20 MG tablet Take 1 tablet (20 mg total) by mouth daily. 07/01/17 07/04/17  Alvira Monday, MD  gabapentin (NEURONTIN) 300 MG capsule Take 300 mg by mouth every evening.    [provider]  glipiZIDE (GLUCOTROL) 5 MG tablet Take 2.5 mg by mouth daily before breakfast.    [provider]  losartan-hydrochlorothiazide (HYZAAR) 100-25 MG per tablet Take 0.5 tablets by mouth daily. 01/25/15  Richarda Overlie, MD  Multiple Vitamin (MULTIVITAMIN) tablet Take 1 tablet by mouth daily.    [provider]  Omeprazole-Sodium Bicarbonate (ZEGERID) 20-1100 MG CAPS capsule Take 1 capsule by mouth daily before breakfast.    [provider]  Polyethyl Glycol-Propyl Glycol (SYSTANE OP) Apply 1-2 drops to eye as needed (dry eyes).    [provider]  traMADol (ULTRAM) 50 MG tablet Take 50 mg by mouth 3 (three) times daily as needed for moderate pain.     [provider]  travoprost, benzalkonium, (TRAVATAN) 0.004 % ophthalmic solution Place 1 drop into both eyes at bedtime.    [provider]  warfarin (COUMADIN) 5 MG tablet Take 5-7.5 mg by mouth daily. Takes 1 1/2 tablets (7.5mg ) daily except Monday and Thursday takes 1 tablet ( )    [provider]    Family History Family History  Problem Relation Age of Onset  . Stroke Brother     Social History Social History  Substance Use Topics  . Smoking status: Never Smoker  . Smokeless tobacco: Never Used  . Alcohol use No     Allergies   Patient has no known allergies.   Review of Systems Review of Systems  Constitutional: Negative for fever.  HENT: Negative for sore throat.   Eyes: Negative for visual disturbance.  Respiratory: Negative for cough and shortness of breath.   Cardiovascular: Positive for leg swelling. Negative for chest pain.  Gastrointestinal: Negative for abdominal pain, nausea and vomiting.  Genitourinary: Negative for difficulty urinating.  Musculoskeletal: Negative for back pain and neck pain.  Skin: Negative for rash.  Neurological: Negative for syncope, weakness, numbness and headaches.     Physical Exam Updated Vital Signs BP (!) 176/70 (BP Location: Right Arm)   Pulse 68   Temp 98.3 F (36.8 C) (Oral)   Resp 20   SpO2 97%   Physical Exam  Constitutional: She is oriented to person, place, and time. She appears well-developed and well-nourished. No distress.  HENT:  Head: Normocephalic and atraumatic.  Eyes: Conjunctivae and EOM are normal.  Neck: Normal range of motion.  Cardiovascular: Normal rate, regular rhythm, normal heart sounds and intact distal pulses.  Exam reveals no gallop and no friction rub.   No murmur heard. Pulmonary/Chest: Effort normal. No respiratory distress. She has no wheezes. She has rales.  Abdominal: Soft. She exhibits no distension. There is no tenderness. There is no guarding.  Musculoskeletal: She exhibits edema (2+). She exhibits no tenderness.  Neurological: She is alert and oriented to person, place, and time.  Skin: Skin is warm and  dry. No rash noted. She is not diaphoretic. No erythema.  Nursing note and vitals reviewed.    ED Treatments / Results  Labs (all labs ordered are listed, but only abnormal results are displayed) Labs Reviewed  CBC WITH DIFFERENTIAL/PLATELET - Abnormal; Notable for the following:       Result Value   RBC 3.23 (*)    Hemoglobin 9.7 (*)    HCT 29.6 (*)    All other components within normal limits  COMPREHENSIVE METABOLIC PANEL - Abnormal; Notable for the following:    Glucose, Bld 120 (*)    BUN 38 (*)    Creatinine, Ser 2.04 (*)    AST 52 (*)    GFR calc non Af Amer 22 (*)    GFR calc Af Amer 25 (*)    All other components within normal limits  PROTIME-INR - Abnormal; Notable for  the following:    Prothrombin Time 32.8 (*)    All other components within normal limits  BRAIN NATRIURETIC PEPTIDE - Abnormal; Notable for the following:    B Natriuretic Peptide 729.7 (*)    All other components within normal limits  URINALYSIS, ROUTINE W REFLEX MICROSCOPIC - Abnormal; Notable for the following:    Hgb urine dipstick MODERATE (*)    Protein, ur 100 (*)    All other components within normal limits  URINALYSIS, MICROSCOPIC (REFLEX) - Abnormal; Notable for the following:    Bacteria, UA FEW (*)    Squamous Epithelial / LPF 0-5 (*)    All other components within normal limits  URINE CULTURE    EKG  EKG Interpretation  Date/Time:  yo female with history of CAD, DM, htn, hlpd, mitral valve replacement presents with concern for leg swelling. Reports dyspnea 6 days ago and fatigue 5 days ago but feels improved with exception of more significant leg swelling now.  CXR shows stable cardiomegaly, mild pulmonary vascular congestion. BNP increased from prior values to 729.  Suspect leg swelling secondary to congestive heart failure. Cr noted to be 2 in the past, most recently was 1.4 and 1.7.  Given patient with swelling, pulmonary vascular congestion, clinical signs of fluid overload, feel CHF most likely cause of edema. Given she does not have shortness of breath, has stable vital signs, feel she is appropriate for outpatient follow up with close management.  Given Cr will provide gentle diuresis  x3 days, have her follow up in next few days with PCP for recheck labwork, titration lasix, outpt ECHO. Patient  discharged in stable condition with understanding of reasons to return.   Final Clinical Impressions(s) / ED Diagnoses   Final diagnoses:  Peripheral edema  Congestive heart failure, unspecified HF chronicity, unspecified heart failure type [facility])    New Prescriptions Discharge Medication List as of 07/01/2017  8:01 PM    START taking these medications   Details  furosemide (  LASIX) 20 MG tablet Take 1 tablet (20 mg total) by mouth daily., Starting Sun 07/01/2017, Until Wed 07/04/2017, Print         Alvira Monday, MD 07/02/17 [phone]

## 2017-07-01 NOTE — ED Notes (Signed)
EDP at bedside  

## 2017-07-01 NOTE — ED Triage Notes (Signed)
Pt reports bilateral leg swelling, dizziness, and urinary retention. Pt states she last urinated 30 minutes ago but did not urinate much yesterday. Pt denies chest pain, SOB.

## 2017-07-03 LAB — URINE CULTURE: Culture: NO GROWTH

## 2017-11-01 IMAGING — CT CT HEAD W/O CM
4 series · 15 of 47 positions shown, 17 images · non-contrast
Comparison: MRI brain 12/22/2015.  CT head 01/16/2015.

CLINICAL DATA: Chronic dizziness. Fall on 11/23/2016. Weakness for
1 week.

EXAM:
CT HEAD WITHOUT CONTRAST
TECHNIQUE: Contiguous axial images were obtained from the base of the skull
through the vertex without intravenous contrast.

[Series 2: head wo · axial · 0.42mm/px · z∈[-186,-86]mm · 7 of 28 slices shown, 9 images]
[im 4/28  brain]
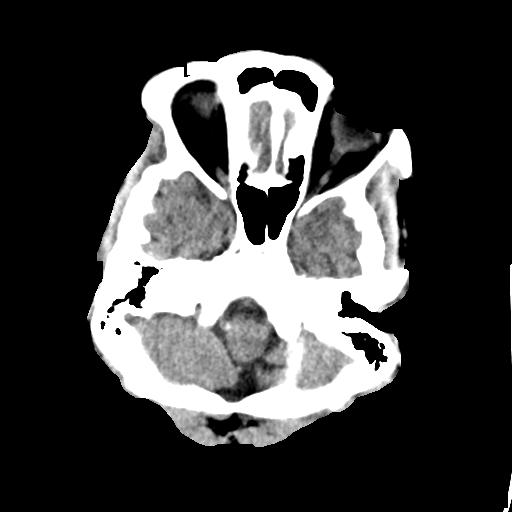
[im 4/28  bone]
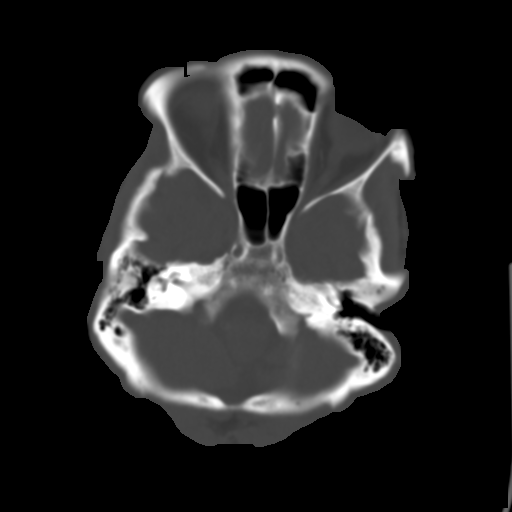
[im 7/28  brain]
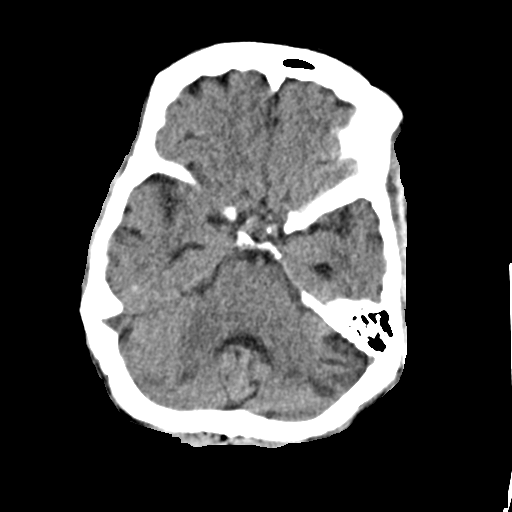
[im 11/28  brain]
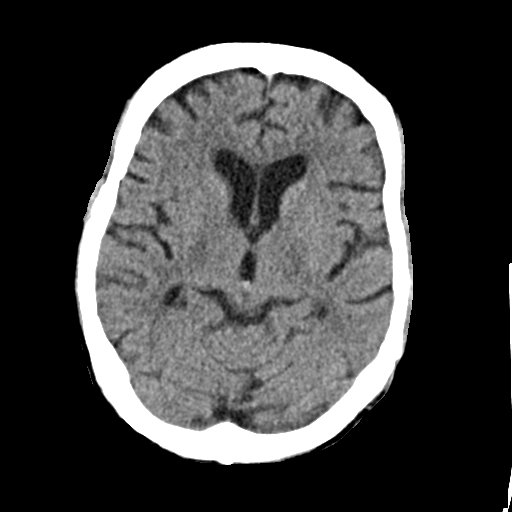
[im 14/28  brain]
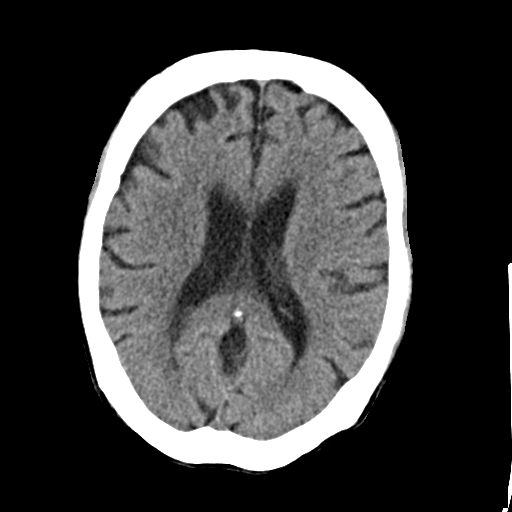
[im 17/28  brain]
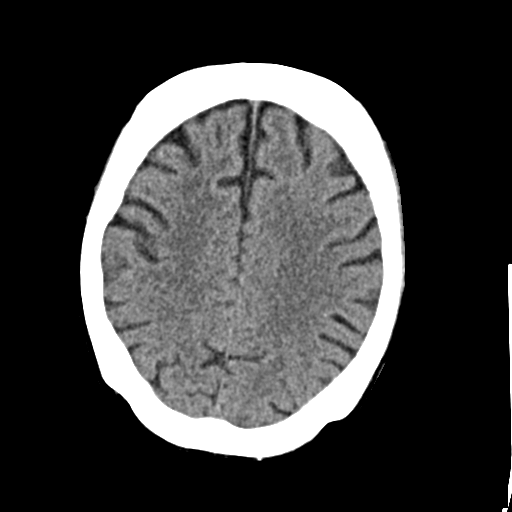
[im 17/28  bone]
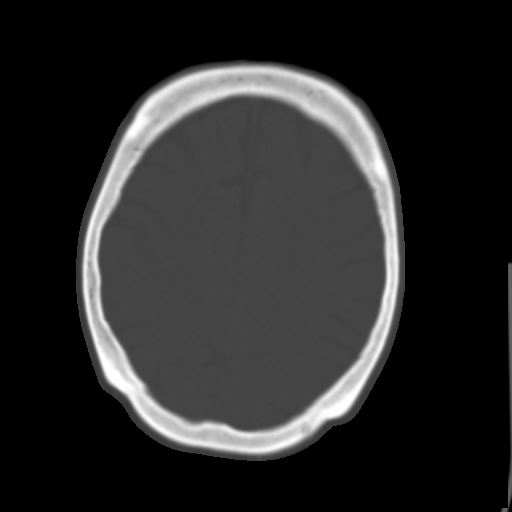
[im 21/28  brain]
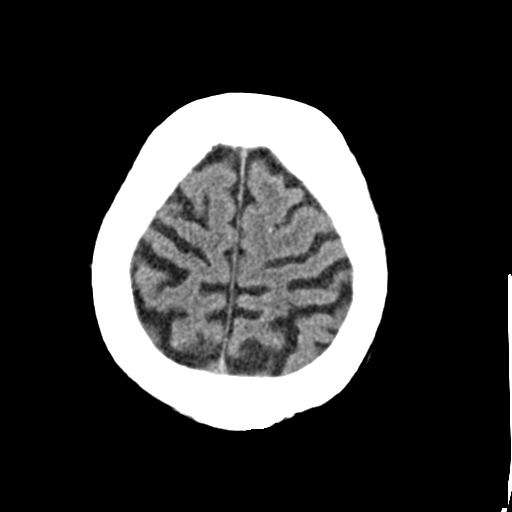
[im 24/28  brain]
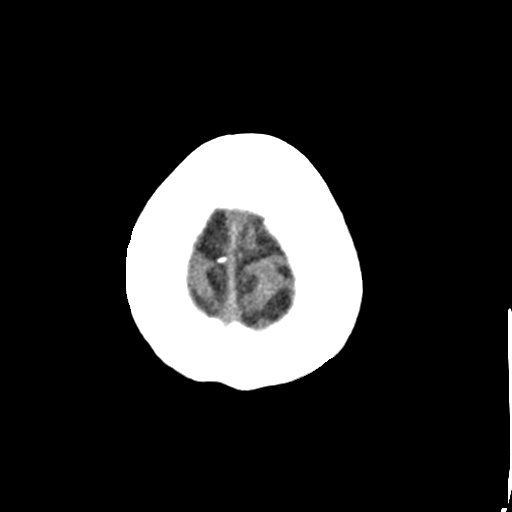

[Series 3: head bone · axial · 0.42mm/px · z∈[-189,-175]mm · 2 of 69 slices shown]
[im 7/69  bone]
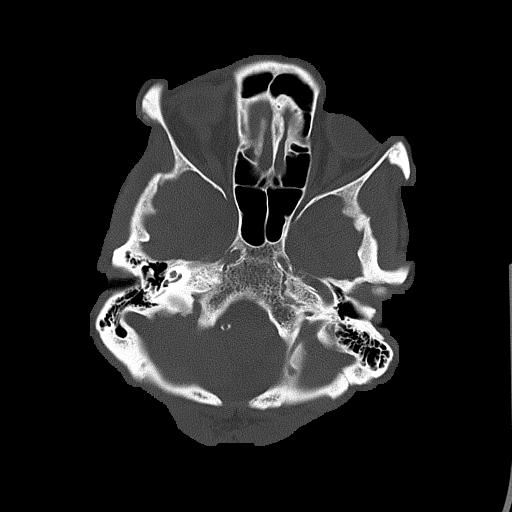
[im 14/69  bone]
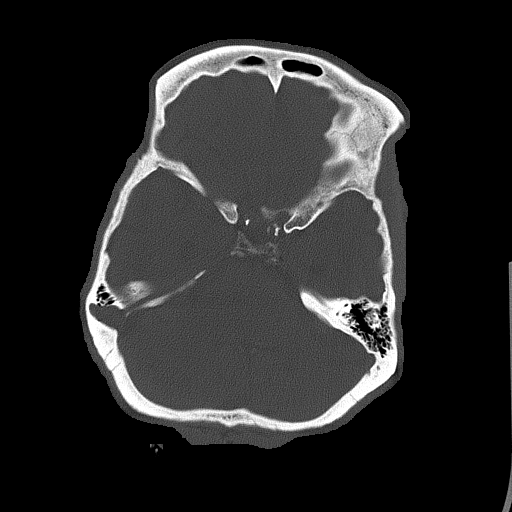

[Series 4: coronal soft · coronal · 0.27mm/px · 3 of 62 slices shown]
[im 21/62  brain]
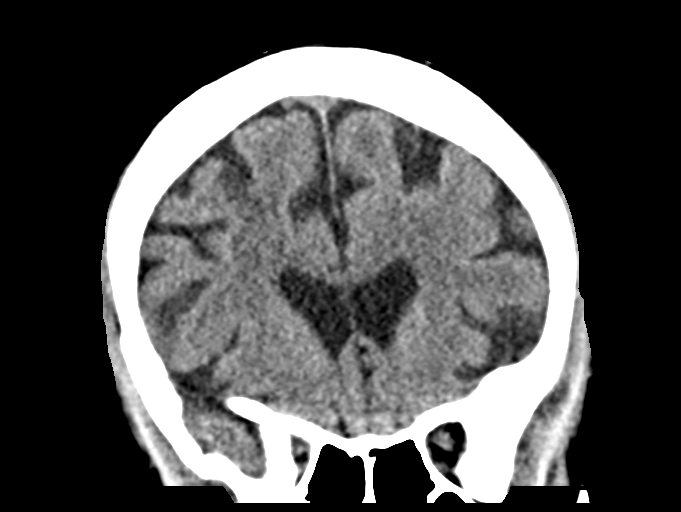
[im 28/62  brain]
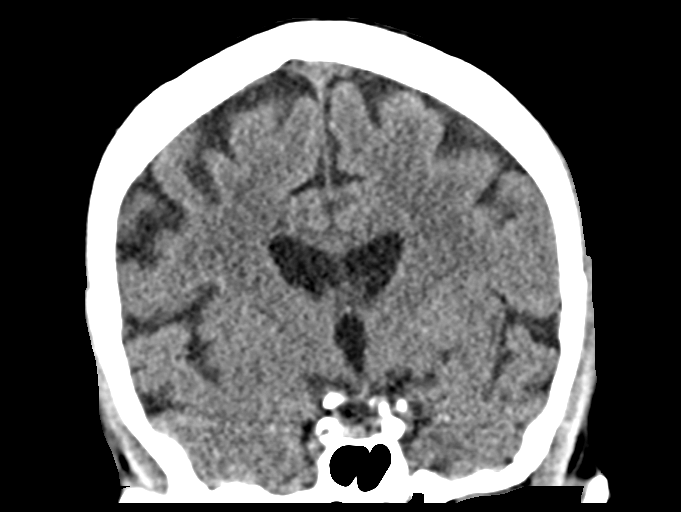
[im 34/62  brain]
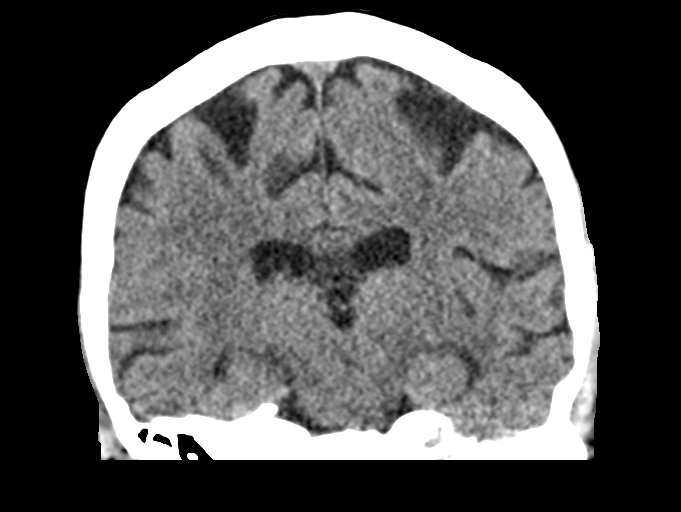

[Series 5: sag soft · sagittal · 0.29mm/px · 3 of 51 slices shown]
[im 17/51  brain]
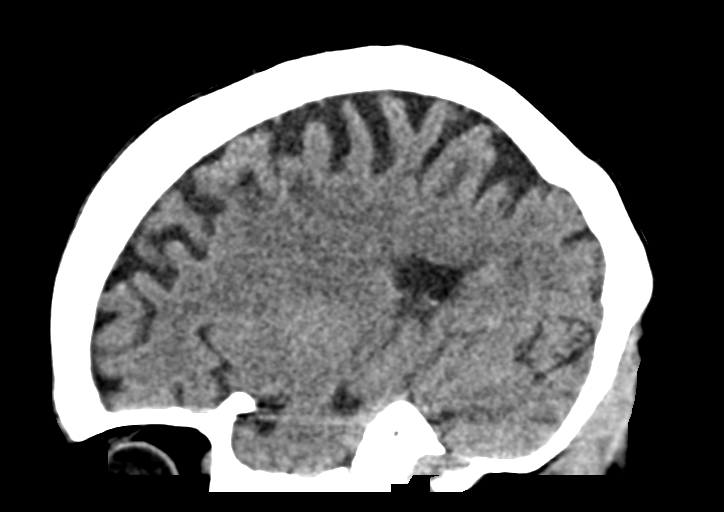
[im 26/51  brain]
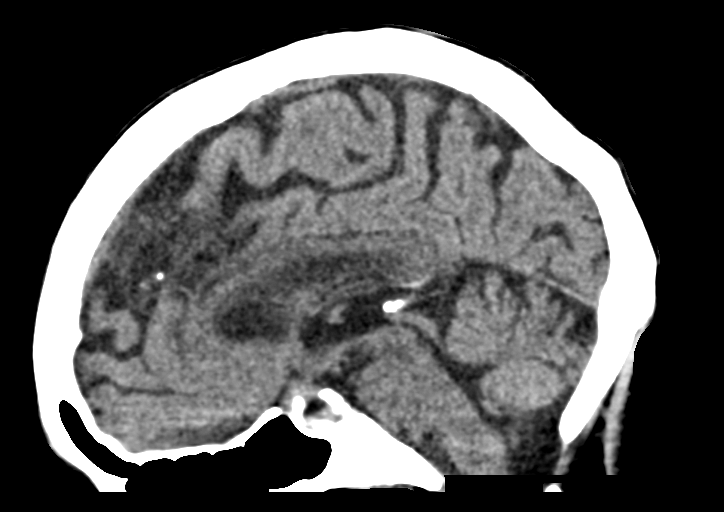
[im 34/51  brain]
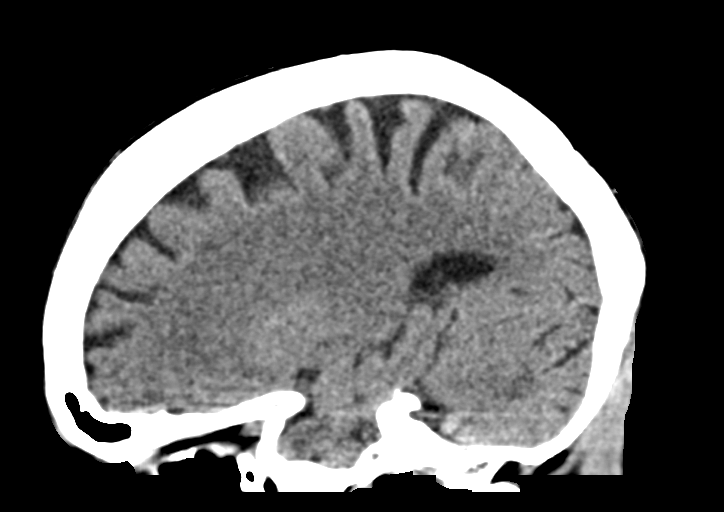

[15 of 47 positions shown; findings below may reference images not displayed]

FINDINGS: Brain: Diffuse cerebral atrophy. Mild ventricular dilatation
consistent with central atrophy. Low-attenuation changes in the deep
white matter consistent with small vessel ischemia. No mass effect
or midline shift. Ventricles and sulci appear symmetrical. No
abnormal extra-axial fluid collections. Gray-white matter junctions
are distinct. Basal cisterns are not effaced. No acute intracranial
hemorrhage.

Vascular: Vascular calcifications are present in the carotid siphons
and vertebrobasilar arteries.

Skull: Calvarium appears intact.

Sinuses/Orbits: Paranasal sinuses and mastoid air cells are not
opacified.

Other: No significant changes since previous study.
IMPRESSION: No acute intracranial abnormalities. Chronic atrophy and small
vessel ischemic changes.

## 2017-11-16 DEATH — deceased

## 2018-06-02 IMAGING — DX DG CHEST 2V
2 series · 2 of 2 positions shown · non-contrast
Comparison: 11/30/2016

CLINICAL DATA: Acute bilateral lower extremity swelling and
dizziness.

EXAM:
CHEST  2 VIEW

[chest pa]
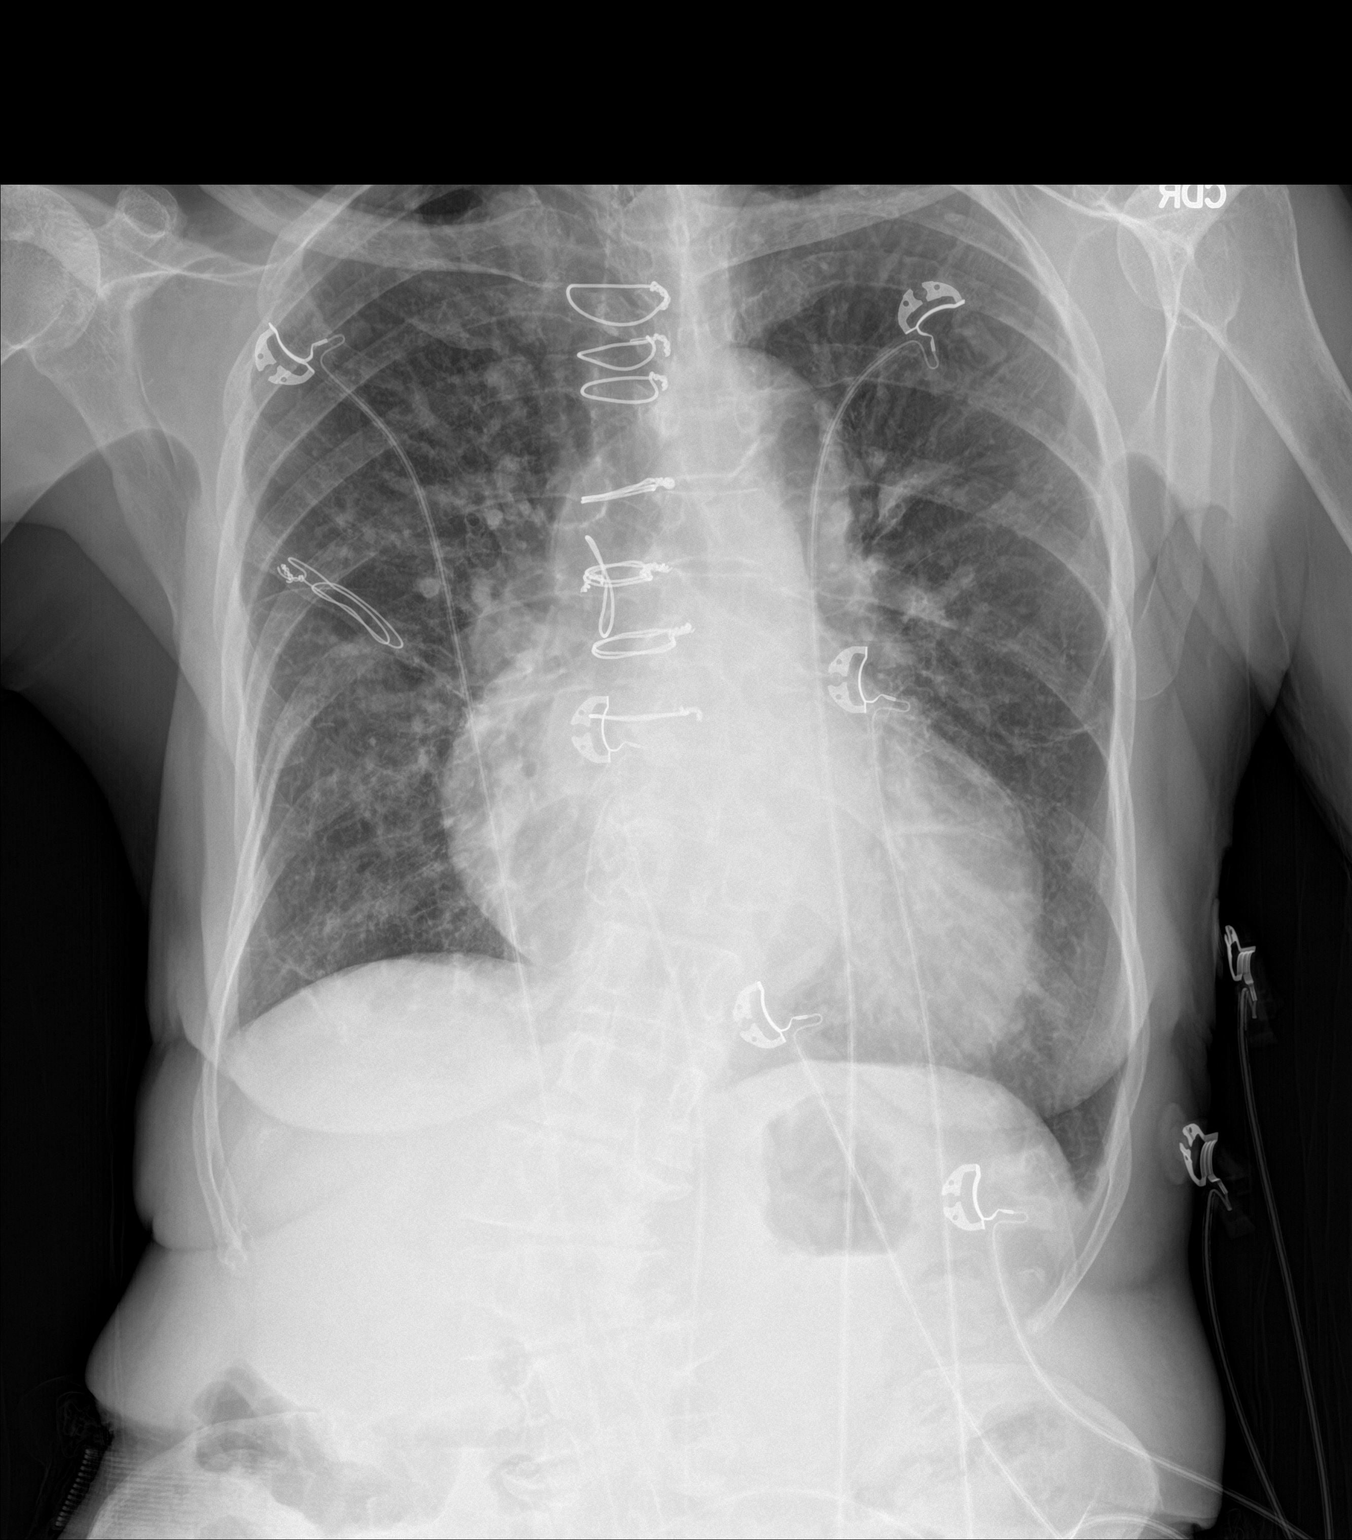

[chest lat]
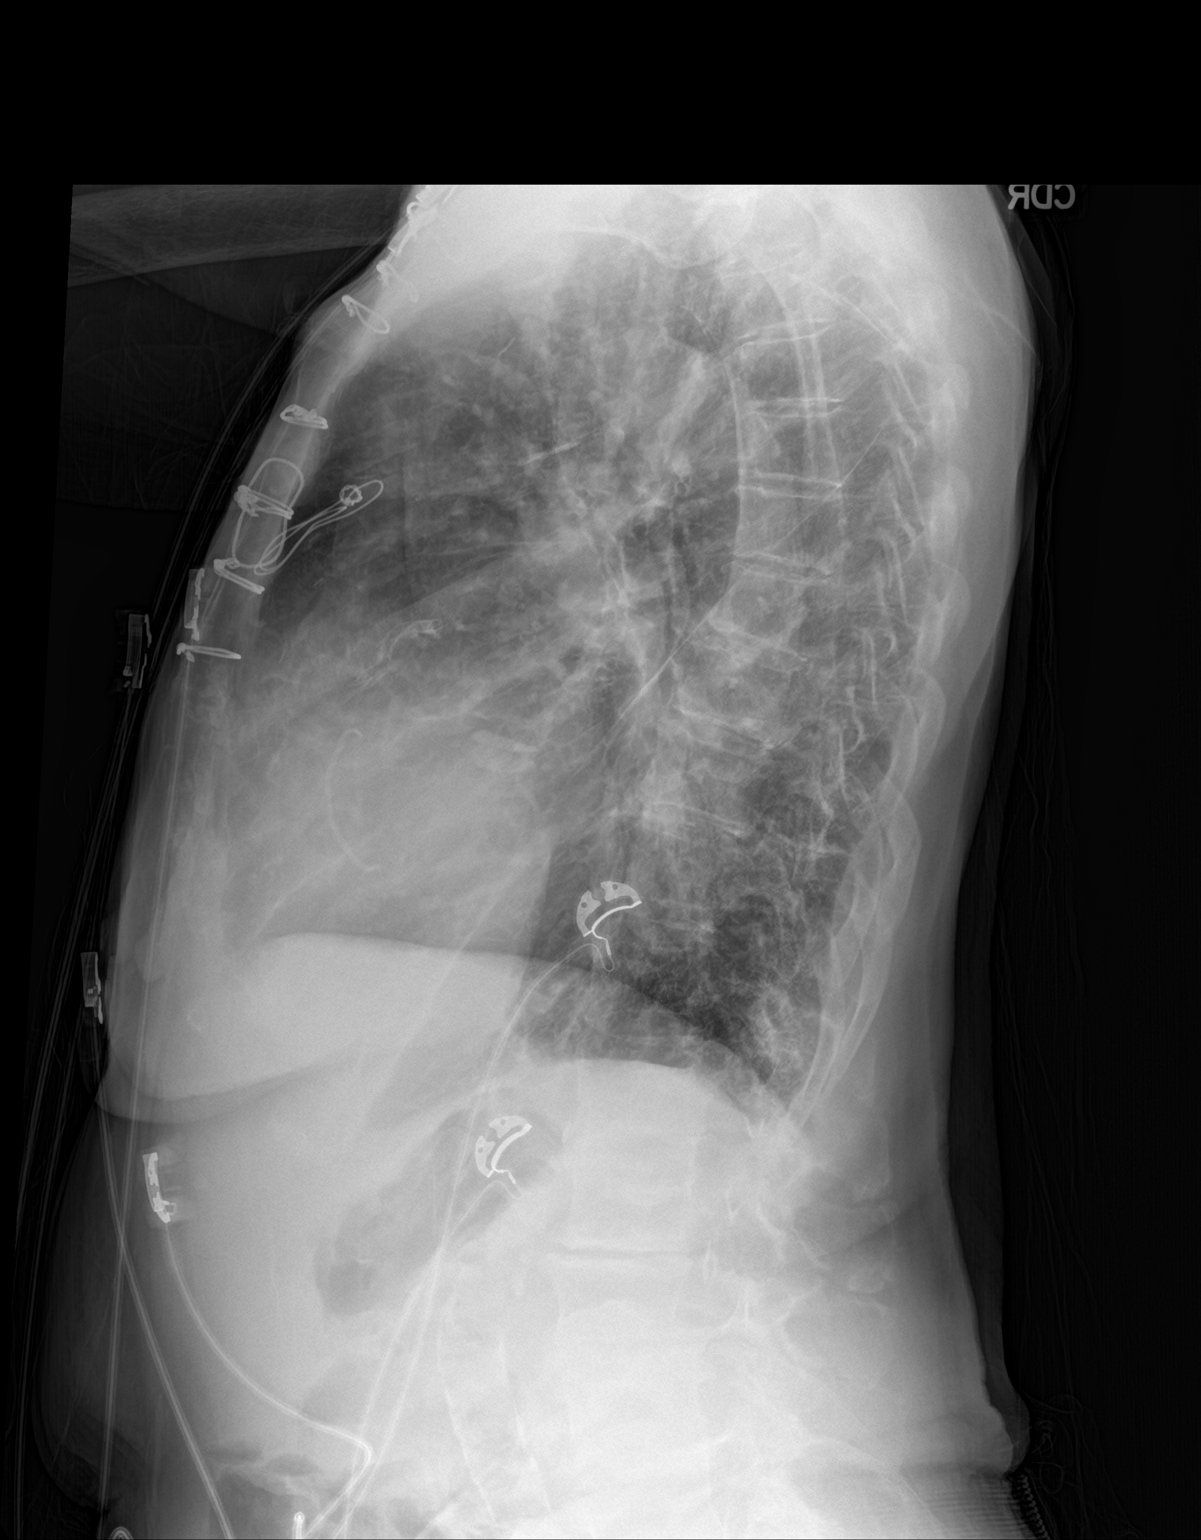

[2 of 2 positions shown; findings below may reference images not displayed]

FINDINGS: Stable cardiomegaly with aortic atherosclerosis. Median sternotomy
sutures are in place. There is mild pulmonary vascular congestion
without pneumonic consolidation, effusion or pneumothorax.
Dextroscoliosis at the thoracolumbar juncture with multilevel
degenerative disc space narrowing and endplate spurring of the
included lumbar spine, stable in appearance.
IMPRESSION: 1. Chronic stable cardiomegaly with aortic atherosclerosis and mild
pulmonary vascular congestion.
2. Chronic stable dextroscoliosis of the thoracolumbar spine with
lumbar spondylosis.
